# Patient Record
Sex: Male | Born: 1960 | Race: Black or African American | Hispanic: No | Marital: Single | Smoking: Current every day smoker
Health system: Southern US, Community
[De-identification: ages and names within clinical notes are randomized; demographics above are authoritative.]

## PROBLEM LIST (undated history)

## (undated) DIAGNOSIS — I10 Essential (primary) hypertension: Secondary | ICD-10-CM

## (undated) DIAGNOSIS — F32A Depression, unspecified: Secondary | ICD-10-CM

## (undated) DIAGNOSIS — F329 Major depressive disorder, single episode, unspecified: Secondary | ICD-10-CM

## (undated) DIAGNOSIS — K859 Acute pancreatitis without necrosis or infection, unspecified: Secondary | ICD-10-CM

## (undated) DIAGNOSIS — Z8659 Personal history of other mental and behavioral disorders: Secondary | ICD-10-CM

## (undated) DIAGNOSIS — M1990 Unspecified osteoarthritis, unspecified site: Secondary | ICD-10-CM

## (undated) HISTORY — DX: Unspecified osteoarthritis, unspecified site: M19.90

## (undated) HISTORY — DX: Depression, unspecified: F32.A

## (undated) HISTORY — DX: Acute pancreatitis without necrosis or infection, unspecified: K85.90

## (undated) HISTORY — DX: Personal history of other mental and behavioral disorders: Z86.59

## (undated) HISTORY — DX: Essential (primary) hypertension: I10

## (undated) HISTORY — DX: Major depressive disorder, single episode, unspecified: F32.9

---

## 2012-07-31 DIAGNOSIS — K859 Acute pancreatitis without necrosis or infection, unspecified: Secondary | ICD-10-CM

## 2012-07-31 HISTORY — DX: Acute pancreatitis without necrosis or infection, unspecified: K85.90

## 2015-11-01 ENCOUNTER — Encounter: Payer: Self-pay | Admitting: *Deleted

## 2015-11-01 ENCOUNTER — Telehealth: Payer: Self-pay | Admitting: *Deleted

## 2015-11-01 NOTE — Telephone Encounter (Signed)
Noted  

## 2015-11-01 NOTE — Telephone Encounter (Signed)
Pre-Visit Call completed with patient and chart updated.   Pre-Visit Info documented in Specialty Comments under [employer].    

## 2015-11-01 NOTE — Telephone Encounter (Signed)
Mitchell Ramirez, see below. Pt is new pt scheduled tomorrow at 2:30pm.   Pt would like CPE and overall "check up" to see how he's doing. "It's been a minute" since he has had any medical care. He is aware that CPE appt will need to be scheduled after tomorrow's appt. He reports he takes Percocet regularly for various aches/pain, but this has never been prescribed to him. He states that it is getting too expensive to buy Percocet off the street, and he would like to discuss pain management options.

## 2015-11-02 ENCOUNTER — Encounter: Payer: Self-pay | Admitting: Physician Assistant

## 2015-11-02 ENCOUNTER — Ambulatory Visit (HOSPITAL_BASED_OUTPATIENT_CLINIC_OR_DEPARTMENT_OTHER)
Admission: RE | Admit: 2015-11-02 | Discharge: 2015-11-02 | Disposition: A | Discharge status: Home / Self Care | Payer: BLUE CROSS/BLUE SHIELD | Source: Ambulatory Visit | Attending: Physician Assistant | Admitting: Physician Assistant

## 2015-11-02 ENCOUNTER — Ambulatory Visit (INDEPENDENT_AMBULATORY_CARE_PROVIDER_SITE_OTHER): Payer: BLUE CROSS/BLUE SHIELD | Admitting: Physician Assistant

## 2015-11-02 VITALS — BP 140/90 | HR 78 | Temp 98.7°F | Ht 65.0 in | Wt 196.8 lb

## 2015-11-02 DIAGNOSIS — M545 Low back pain, unspecified: Secondary | ICD-10-CM

## 2015-11-02 DIAGNOSIS — M25561 Pain in right knee: Secondary | ICD-10-CM | POA: Diagnosis not present

## 2015-11-02 DIAGNOSIS — F17200 Nicotine dependence, unspecified, uncomplicated: Secondary | ICD-10-CM | POA: Diagnosis not present

## 2015-11-02 DIAGNOSIS — M25562 Pain in left knee: Secondary | ICD-10-CM

## 2015-11-02 DIAGNOSIS — G8929 Other chronic pain: Secondary | ICD-10-CM

## 2015-11-02 DIAGNOSIS — F1921 Other psychoactive substance dependence, in remission: Secondary | ICD-10-CM

## 2015-11-02 DIAGNOSIS — M5136 Other intervertebral disc degeneration, lumbar region: Secondary | ICD-10-CM | POA: Diagnosis not present

## 2015-11-02 DIAGNOSIS — F1090 Alcohol use, unspecified, uncomplicated: Secondary | ICD-10-CM

## 2015-11-02 DIAGNOSIS — F1099 Alcohol use, unspecified with unspecified alcohol-induced disorder: Secondary | ICD-10-CM | POA: Diagnosis not present

## 2015-11-02 DIAGNOSIS — IMO0002 Reserved for concepts with insufficient information to code with codable children: Secondary | ICD-10-CM

## 2015-11-02 MED ORDER — VARENICLINE TARTRATE 0.5 MG X 11 & 1 MG X 42 PO MISC: ORAL | Status: AC

## 2015-11-02 MED ORDER — DICLOFENAC POTASSIUM 50 MG PO TABS: 50.0000 mg | ORAL_TABLET | Freq: Two times a day (BID) | ORAL | Status: DC

## 2015-11-02 NOTE — Progress Notes (Signed)
Patient presents to clinic today to establish care.  Acute Concerns: Patient c/o chronic pain in lower back and bilateral knees. Denies known trauma or injury. Endorses persistent aching pains in these areas without radiation. Has been taking Percocet for pain. States he is not prescribed the medication but has been getting it off of the street.   Chronic Issues: History of Alcohol and Drug Abuse -- Endorses prior cocaine use > 10 years ago. Also has history of alcohol abuse with multiple episodes of alcoholic pancreatitis.  Endorses he has significantly reduced alcohol consumption to maybe 1-2 beers each day. Is not currently attending AA.   Tobacco Abuse Disorder -- Endorses 1 ppd of cigarettes x 20 years. Is ready to quit. Would like to discuss options.   Health Maintenance: Immunizations -- Endorses Tetanus given 07/31/14.  Past Medical History  Diagnosis Date  . Pancreatitis 2014  . Arthritis   . Depression   . History of eating disorder   . Hypertension     History reviewed. No pertinent past surgical history.  Current Outpatient Prescriptions on File Prior to Visit  Medication Sig Dispense Refill  . loratadine (CLARITIN) 10 MG tablet Take 10 mg by mouth daily as needed for allergies.    . Multiple Vitamins-Minerals (MULTIVITAMIN ADULT PO) Take 1 tablet by mouth daily.    . Oxymetazoline HCl (NASAL SPRAY NA) Place 1 spray into both nostrils daily. PRN Allergies     No current facility-administered medications on file prior to visit.    No Known Allergies  Family History  Problem Relation Age of Onset  . Hypertension Mother   . Arthritis Mother   . Cancer Father     Colon    Social History   Social History  . Marital Status: Single    Spouse Name: N/A  . Number of Children: 6  . Years of Education: N/A   Occupational History  . Not on file.   Social History Main Topics  . Smoking status: Current Every Day Smoker -- 1.00 packs/day    Types: Cigarettes   Start date: 11/02/1995  . Smokeless tobacco: Never Used  . Alcohol Use: 0.0 oz/week    0 Standard drinks or equivalent per week     Comment: 2-3 Beers per day  . Drug Use: No  . Sexual Activity: Not on file   Other Topics Concern  . Not on file   Social History Narrative   Review of Systems  Constitutional: Negative for fever and weight loss.  HENT: Negative for ear discharge, ear pain, hearing loss and tinnitus.   Eyes: Negative for blurred vision, double vision, photophobia and pain.  Respiratory: Negative for cough and shortness of breath.   Cardiovascular: Negative for chest pain and palpitations.  Gastrointestinal: Negative for heartburn, nausea, vomiting, abdominal pain, diarrhea, constipation, blood in stool and melena.  Genitourinary: Negative for dysuria, urgency, frequency, hematuria and flank pain.  Musculoskeletal: Positive for joint pain. Negative for falls.  Neurological: Negative for dizziness, loss of consciousness and headaches.  Endo/Heme/Allergies: Negative for environmental allergies.  Psychiatric/Behavioral: Positive for substance abuse. Negative for depression, suicidal ideas and hallucinations. The patient is not nervous/anxious and does not have insomnia.    BP 140/90 mmHg  Pulse 78  Temp(Src) 98.7 F (37.1 C) (Oral)  Ht 5\' 5"  (1.651 m)  Wt 196 lb 12.8 oz (89.268 kg)  BMI 32.75 kg/m2  SpO2 97%  Physical Exam  Constitutional: He is oriented to person, place, and time and well-developed,  well-nourished, and in no distress.  HENT:  Head: Normocephalic and atraumatic.  Eyes: Conjunctivae are normal.  Neck: Neck supple.  Cardiovascular: Normal rate, regular rhythm, normal heart sounds and intact distal pulses.   Pulmonary/Chest: Effort normal and breath sounds normal. No respiratory distress. He has no wheezes. He has no rales. He exhibits no tenderness.  Musculoskeletal:       Right knee: Normal.       Left knee: Normal.       Thoracic back: Normal.         Lumbar back: He exhibits pain. He exhibits normal range of motion, no tenderness, no bony tenderness and no spasm.  Neurological: He is alert and oriented to person, place, and time.  Skin: Skin is warm and dry. No rash noted.  Psychiatric: Affect normal.  Vitals reviewed.    Assessment/Plan: Alcohol use disorder (HCC) Currently drinking 1-2 beers a night. Discussed effect of alcohol on liver function, blood vessels/blood pressure, etc. Will work on complete cessation. AA recommended.  Tobacco use disorder Ready to quit. Discussed options for smoking cessation. Unable to previously tolerate patches. Will begin Chantix starter pack. FU 1 month.  Bilateral knee pain Suspect osteoarthritis. Discussed imaging. Patient declines today. Will begin trial of diclofenac. FU 1 month. Giving history of substance abuse and that he has been using Percocet off of the street, we will not be prescribing controlled medications due to risk of abuse. He is aware that he is not to take medications that are not prescribed to him.  Chronic low back pain Suspect OA. Will proceed with x-ray lumbar spine. Will begin Diclofenac. Supportive measures reviewed. FU as scheduled.   History of drug dependence/abuse (HCC) Endorses prior cocaine abuse. Also with history of alcohol misuse and current tobacco abuse disorder. Patient will not be a candidate for controlled medications due to abuse potential. Added controlled medication caution to patient's chart.

## 2015-11-02 NOTE — Progress Notes (Signed)
Pre visit review using our clinic review tool, if applicable. No additional management support is needed unless otherwise documented below in the visit note. 

## 2015-11-02 NOTE — Patient Instructions (Signed)
Please go downstairs for imaging. I will call with your results.  Please start the Diclofenac as directed with food. Topical Icy Hot or Aspercreme to the knees and lower back. We will alter regimen based on results.   Do not take prescription medications that are not prescribed for you.  Once tolerating the first medication for about 4-5 days, you can start the Chantix for smoking cessation.   Please call either of the following for outpatient alcohol abuse programs: (1) [facility] -- [phone] (2) [facility] -- [phone] (3) [facility] -- [phone]  Follow-up with me in 1 month. I recommend we do a physical at that time.

## 2015-11-07 DIAGNOSIS — G8929 Other chronic pain: Secondary | ICD-10-CM | POA: Insufficient documentation

## 2015-11-07 DIAGNOSIS — IMO0002 Reserved for concepts with insufficient information to code with codable children: Secondary | ICD-10-CM | POA: Insufficient documentation

## 2015-11-07 DIAGNOSIS — F17200 Nicotine dependence, unspecified, uncomplicated: Secondary | ICD-10-CM | POA: Insufficient documentation

## 2015-11-07 DIAGNOSIS — M545 Low back pain: Principal | ICD-10-CM

## 2015-11-07 DIAGNOSIS — M25561 Pain in right knee: Secondary | ICD-10-CM | POA: Insufficient documentation

## 2015-11-07 DIAGNOSIS — F1921 Other psychoactive substance dependence, in remission: Secondary | ICD-10-CM | POA: Insufficient documentation

## 2015-11-07 DIAGNOSIS — M25562 Pain in left knee: Secondary | ICD-10-CM

## 2015-11-07 NOTE — Assessment & Plan Note (Signed)
Currently drinking 1-2 beers a night. Discussed effect of alcohol on liver function, blood vessels/blood pressure, etc. Will work on complete cessation. AA recommended.

## 2015-11-07 NOTE — Assessment & Plan Note (Signed)
Ready to quit. Discussed options for smoking cessation. Unable to previously tolerate patches. Will begin Chantix starter pack. FU 1 month.

## 2015-11-07 NOTE — Assessment & Plan Note (Signed)
Endorses prior cocaine abuse. Also with history of alcohol misuse and current tobacco abuse disorder. Patient will not be a candidate for controlled medications due to abuse potential. Added controlled medication caution to patient's chart.

## 2015-11-07 NOTE — Assessment & Plan Note (Signed)
Suspect osteoarthritis. Discussed imaging. Patient declines today. Will begin trial of diclofenac. FU 1 month. Giving history of substance abuse and that he has been using Percocet off of the street, we will not be prescribing controlled medications due to risk of abuse. He is aware that he is not to take medications that are not prescribed to him.

## 2015-11-07 NOTE — Assessment & Plan Note (Signed)
Suspect OA. Will proceed with x-ray lumbar spine. Will begin Diclofenac. Supportive measures reviewed. FU as scheduled.

## 2015-11-22 ENCOUNTER — Ambulatory Visit (INDEPENDENT_AMBULATORY_CARE_PROVIDER_SITE_OTHER): Payer: BLUE CROSS/BLUE SHIELD | Admitting: Physician Assistant

## 2015-11-22 ENCOUNTER — Encounter: Payer: Self-pay | Admitting: Physician Assistant

## 2015-11-22 ENCOUNTER — Telehealth: Payer: Self-pay | Admitting: Physician Assistant

## 2015-11-22 VITALS — BP 148/96 | HR 86 | Temp 98.2°F | Ht 67.0 in | Wt 199.6 lb

## 2015-11-22 DIAGNOSIS — R7989 Other specified abnormal findings of blood chemistry: Secondary | ICD-10-CM

## 2015-11-22 DIAGNOSIS — Z0000 Encounter for general adult medical examination without abnormal findings: Secondary | ICD-10-CM

## 2015-11-22 DIAGNOSIS — Z125 Encounter for screening for malignant neoplasm of prostate: Secondary | ICD-10-CM | POA: Diagnosis not present

## 2015-11-22 DIAGNOSIS — G894 Chronic pain syndrome: Secondary | ICD-10-CM | POA: Diagnosis not present

## 2015-11-22 DIAGNOSIS — IMO0002 Reserved for concepts with insufficient information to code with codable children: Secondary | ICD-10-CM

## 2015-11-22 DIAGNOSIS — Z8719 Personal history of other diseases of the digestive system: Secondary | ICD-10-CM

## 2015-11-22 DIAGNOSIS — F1099 Alcohol use, unspecified with unspecified alcohol-induced disorder: Secondary | ICD-10-CM | POA: Diagnosis not present

## 2015-11-22 LAB — COMPREHENSIVE METABOLIC PANEL
ALT: 17 U/L (ref 0–53)
AST: 26 U/L (ref 0–37)
Albumin: 4.3 g/dL (ref 3.5–5.2)
Alkaline Phosphatase: 58 U/L (ref 39–117)
BUN: 4 mg/dL — AB (ref 6–23)
CHLORIDE: 97 meq/L (ref 96–112)
CO2: 32 meq/L (ref 19–32)
CREATININE: 0.79 mg/dL (ref 0.40–1.50)
Calcium: 9.8 mg/dL (ref 8.4–10.5)
GFR: 132.11 mL/min (ref >60.00)
Glucose, Bld: 190 mg/dL — ABNORMAL HIGH (ref 70–99)
Potassium: 4.2 mEq/L (ref 3.5–5.1)
SODIUM: 138 meq/L (ref 135–145)
Total Bilirubin: 0.3 mg/dL (ref 0.2–1.2)
Total Protein: 6.5 g/dL (ref 6.0–8.3)

## 2015-11-22 LAB — LIPID PANEL
CHOL/HDL RATIO: 3
Cholesterol: 201 mg/dL — ABNORMAL HIGH (ref 0–200)
HDL: 67.1 mg/dL (ref >39.00)
NONHDL: 134.13
Triglycerides: 307 mg/dL — ABNORMAL HIGH (ref 0.0–149.0)
VLDL: 61.4 mg/dL — ABNORMAL HIGH (ref 0.0–40.0)

## 2015-11-22 LAB — LDL CHOLESTEROL, DIRECT: LDL DIRECT: 102 mg/dL

## 2015-11-22 LAB — PSA: PSA: 0.44 ng/mL (ref 0.10–4.00)

## 2015-11-22 LAB — URINALYSIS, ROUTINE W REFLEX MICROSCOPIC
Bilirubin Urine: NEGATIVE
HGB URINE DIPSTICK: NEGATIVE
Ketones, ur: NEGATIVE
Leukocytes, UA: NEGATIVE
Nitrite: NEGATIVE
PH: 7.5 (ref 5.0–8.0)
RBC / HPF: NONE SEEN (ref 0–?)
Specific Gravity, Urine: 1.01 (ref 1.000–1.030)
TOTAL PROTEIN, URINE-UPE24: NEGATIVE
Urine Glucose: 100 — AB
Urobilinogen, UA: 0.2 (ref 0.0–1.0)
WBC, UA: NONE SEEN (ref 0–?)

## 2015-11-22 LAB — CBC
HCT: 42 % (ref 39.0–52.0)
Hemoglobin: 14.2 g/dL (ref 13.0–17.0)
MCHC: 33.7 g/dL (ref 30.0–36.0)
MCV: 97.3 fl (ref 78.0–100.0)
Platelets: 343 10*3/uL (ref 150.0–400.0)
RBC: 4.32 Mil/uL (ref 4.22–5.81)
RDW: 12.2 % (ref 11.5–15.5)
WBC: 7.2 10*3/uL (ref 4.0–10.5)

## 2015-11-22 LAB — HEMOGLOBIN A1C: HEMOGLOBIN A1C: 8.1 % — AB (ref 4.6–6.5)

## 2015-11-22 LAB — TSH: TSH: 0.94 u[IU]/mL (ref 0.35–4.50)

## 2015-11-22 MED ORDER — GABAPENTIN 100 MG PO CAPS: 100.0000 mg | ORAL_CAPSULE | Freq: Two times a day (BID) | ORAL | Status: DC

## 2015-11-22 MED ORDER — GABAPENTIN 100 MG PO CAPS: 100.0000 mg | ORAL_CAPSULE | Freq: Three times a day (TID) | ORAL | Status: DC

## 2015-11-22 MED ORDER — CYCLOBENZAPRINE HCL 5 MG PO TABS: 5.0000 mg | ORAL_TABLET | Freq: Every day | ORAL | Status: DC

## 2015-11-22 MED ORDER — ONDANSETRON HCL 4 MG PO TABS: 4.0000 mg | ORAL_TABLET | Freq: Three times a day (TID) | ORAL | Status: DC | PRN

## 2015-11-22 NOTE — Telephone Encounter (Signed)
Have him call [phone] -- this is for a place [facility] [place] [place] [facility] be able to help.

## 2015-11-22 NOTE — Patient Instructions (Signed)
Please go to the lab for blood work.   Our office will call you with your results unless you have chosen to receive results via MyChart.  If your blood work is normal we will follow-up each year for physicals and as scheduled for chronic medical problems.  If anything is abnormal we will treat accordingly and get you in for a follow-up.  Please take the new medications as directed.  Please call [facility] [phone]. You would benefit greatly from their programs.  Preventive Care for Adults, Male A healthy lifestyle and preventive care can promote health and wellness. Preventive health guidelines for men include the following key practices:  A routine yearly physical is a good way to check with your health care provider about your health and preventative screening. It is a chance to share any concerns and updates on your health and to receive a thorough exam.  Visit your dentist for a routine exam and preventative care every 6 months. Brush your teeth twice a day and floss once a day. Good oral hygiene prevents tooth decay and gum disease.  The frequency of eye exams is based on your age, health, family medical history, use of contact lenses, and other factors. Follow your health care provider's recommendations for frequency of eye exams.  Eat a healthy diet. Foods such as vegetables, fruits, whole grains, low-fat dairy products, and lean protein foods contain the nutrients you need without too many calories. Decrease your intake of foods high in solid fats, added sugars, and salt. Eat the right amount of calories for you.Get information about a proper diet from your health care provider, if necessary.  Regular physical exercise is one of the most important things you can do for your health. Most adults should get at least 150 minutes of moderate-intensity exercise (any activity that increases your heart rate and causes you to sweat) each week. In addition, most adults need  muscle-strengthening exercises on 2 or more days a week.  Maintain a healthy weight. The body mass index (BMI) is a screening tool to identify possible weight problems. It provides an estimate of body fat based on height and weight. Your health care provider can find your BMI and can help you achieve or maintain a healthy weight.For adults 20 years and older:  A BMI below 18.5 is considered underweight.  A BMI of 18.5 to 24.9 is normal.  A BMI of 25 to 29.9 is considered overweight.  A BMI of 30 and above is considered obese.  Maintain normal blood lipids and cholesterol levels by exercising and minimizing your intake of saturated fat. Eat a balanced diet with plenty of fruit and vegetables. Blood tests for lipids and cholesterol should begin at age 71 and be repeated every 5 years. If your lipid or cholesterol levels are high, you are over 50, or you are at high risk for heart disease, you may need your cholesterol levels checked more frequently.Ongoing high lipid and cholesterol levels should be treated with medicines if diet and exercise are not working.  If you smoke, find out from your health care provider how to quit. If you do not use tobacco, do not start.  Lung cancer screening is recommended for adults aged 53-80 years who are at high risk for developing lung cancer because of a history of smoking. A yearly low-dose CT scan of the lungs is recommended for people who have at least a 30-pack-year history of smoking and are a current smoker or have quit within the  past 15 years. A pack year of smoking is smoking an average of 1 pack of cigarettes a day for 1 year (for example: 1 pack a day for 30 years or 2 packs a day for 15 years). Yearly screening should continue until the smoker has stopped smoking for at least 15 years. Yearly screening should be stopped for people who develop a health problem that would prevent them from having lung cancer treatment.  If you choose to drink alcohol,  do not have more than 2 drinks per day. One drink is considered to be 12 ounces (355 mL) of beer, 5 ounces (148 mL) of wine, or 1.5 ounces (44 mL) of liquor.  Avoid use of street drugs. Do not share needles with anyone. Ask for help if you need support or instructions about stopping the use of drugs.  High blood pressure causes heart disease and increases the risk of stroke. Your blood pressure should be checked at least every 1-2 years. Ongoing high blood pressure should be treated with medicines, if weight loss and exercise are not effective.  If you are 4-79 years old, ask your health care provider if you should take aspirin to prevent heart disease.  Diabetes screening is done by taking a blood sample to check your blood glucose level after you have not eaten for a certain period of time (fasting). If you are not overweight and you do not have risk factors for diabetes, you should be screened once every 3 years starting at age 45. If you are overweight or obese and you are 49-61 years of age, you should be screened for diabetes every year as part of your cardiovascular risk assessment.  Colorectal cancer can be detected and often prevented. Most routine colorectal cancer screening begins at the age of 64 and continues through age 14. However, your health care provider may recommend screening at an earlier age if you have risk factors for colon cancer. On a yearly basis, your health care provider may provide home test kits to check for hidden blood in the stool. Use of a small camera at the end of a tube to directly examine the colon (sigmoidoscopy or colonoscopy) can detect the earliest forms of colorectal cancer. Talk to your health care provider about this at age 52, when routine screening begins. Direct exam of the colon should be repeated every 5-10 years through age 63, unless early forms of precancerous polyps or small growths are found.  People who are at an increased risk for hepatitis B  should be screened for this virus. You are considered at high risk for hepatitis B if:  You were born in a country where hepatitis B occurs often. Talk with your health care provider about which countries are considered high risk.  Your parents were born in a high-risk country and you have not received a shot to protect against hepatitis B (hepatitis B vaccine).  You have HIV or AIDS.  You use needles to inject street drugs.  You live with, or have sex with, someone who has hepatitis B.  You are a man who has sex with other men (MSM).  You get hemodialysis treatment.  You take certain medicines for conditions such as cancer, organ transplantation, and autoimmune conditions.  Hepatitis C blood testing is recommended for all people born from [birth year] through 1965 and any individual with known risks for hepatitis C.  Practice safe sex. Use condoms and avoid high-risk sexual practices to reduce the spread of sexually transmitted infections (  STIs). STIs include gonorrhea, chlamydia, syphilis, trichomonas, herpes, HPV, and human immunodeficiency virus (HIV). Herpes, HIV, and HPV are viral illnesses that have no cure. They can result in disability, cancer, and death.  If you are a man who has sex with other men, you should be screened at least once per year for:  HIV.  Urethral, rectal, and pharyngeal infection of gonorrhea, chlamydia, or both.  If you are at risk of being infected with HIV, it is recommended that you take a prescription medicine daily to prevent HIV infection. This is called preexposure prophylaxis (PrEP). You are considered at risk if:  You are a man who has sex with other men (MSM) and have other risk factors.  You are a heterosexual man, are sexually active, and are at increased risk for HIV infection.  You take drugs by injection.  You are sexually active with a partner who has HIV.  Talk with your health care provider about whether you are at high risk of being  infected with HIV. If you choose to begin PrEP, you should first be tested for HIV. You should then be tested every 3 months for as long as you are taking PrEP.  A one-time screening for abdominal aortic aneurysm (AAA) and surgical repair of large AAAs by ultrasound are recommended for men ages 47 to 12 years who are current or former smokers.  Healthy men should no longer receive prostate-specific antigen (PSA) blood tests as part of routine cancer screening. Talk with your health care provider about prostate cancer screening.  Testicular cancer screening is not recommended for adult males who have no symptoms. Screening includes self-exam, a health care provider exam, and other screening tests. Consult with your health care provider about any symptoms you have or any concerns you have about testicular cancer.  Use sunscreen. Apply sunscreen liberally and repeatedly throughout the day. You should seek shade when your shadow is shorter than you. Protect yourself by wearing long sleeves, pants, a wide-brimmed hat, and sunglasses year round, whenever you are outdoors.  Once a month, do a whole-body skin exam, using a mirror to look at the skin on your back. Tell your health care provider about new moles, moles that have irregular borders, moles that are larger than a pencil eraser, or moles that have changed in shape or color.  Stay current with required vaccines (immunizations).  Influenza vaccine. All adults should be immunized every year.  Tetanus, diphtheria, and acellular pertussis (Td, Tdap) vaccine. An adult who has not previously received Tdap or who does not know his vaccine status should receive 1 dose of Tdap. This initial dose should be followed by tetanus and diphtheria toxoids (Td) booster doses every 10 years. Adults with an unknown or incomplete history of completing a 3-dose immunization series with Td-containing vaccines should begin or complete a primary immunization series including  a Tdap dose. Adults should receive a Td booster every 10 years.  Varicella vaccine. An adult without evidence of immunity to varicella should receive 2 doses or a second dose if he has previously received 1 dose.  Human papillomavirus (HPV) vaccine. Males aged 11-21 years who have not received the vaccine previously should receive the 3-dose series. Males aged 22-26 years may be immunized. Immunization is recommended through the age of 49 years for any male who has sex with males and did not get any or all doses earlier. Immunization is recommended for any person with an immunocompromised condition through the age of 35 years if  he did not get any or all doses earlier. During the 3-dose series, the second dose should be obtained 4-8 weeks after the first dose. The third dose should be obtained 24 weeks after the first dose and 16 weeks after the second dose.  Zoster vaccine. One dose is recommended for adults aged 25 years or older unless certain conditions are present.  Measles, mumps, and rubella (MMR) vaccine. Adults born before [birth year] generally are considered immune to measles and mumps. Adults born in [birth year] or later should have 1 or more doses of MMR vaccine unless there is a contraindication to the vaccine or there is laboratory evidence of immunity to each of the three diseases. A routine second dose of MMR vaccine should be obtained at least 28 days after the first dose for students attending postsecondary schools, health care workers, or international travelers. People who received inactivated measles vaccine or an unknown type of measles vaccine during 1963-1967 should receive 2 doses of MMR vaccine. People who received inactivated mumps vaccine or an unknown type of mumps vaccine before 1979 and are at high risk for mumps infection should consider immunization with 2 doses of MMR vaccine. Unvaccinated health care workers born before [birth year] who lack laboratory evidence of measles, mumps, or rubella  immunity or laboratory confirmation of disease should consider measles and mumps immunization with 2 doses of MMR vaccine or rubella immunization with 1 dose of MMR vaccine.  Pneumococcal 13-valent conjugate (PCV13) vaccine. When indicated, a person who is uncertain of his immunization history and has no record of immunization should receive the PCV13 vaccine. All adults 17 years of age and older should receive this vaccine. An adult aged 44 years or older who has certain medical conditions and has not been previously immunized should receive 1 dose of PCV13 vaccine. This PCV13 should be followed with a dose of pneumococcal polysaccharide (PPSV23) vaccine. Adults who are at high risk for pneumococcal disease should obtain the PPSV23 vaccine at least 8 weeks after the dose of PCV13 vaccine. Adults older than 53 years of age who have normal immune system function should obtain the PPSV23 vaccine dose at least 1 year after the dose of PCV13 vaccine.  Pneumococcal polysaccharide (PPSV23) vaccine. When PCV13 is also indicated, PCV13 should be obtained first. All adults aged 10 years and older should be immunized. An adult younger than age 66 years who has certain medical conditions should be immunized. Any person who resides in a nursing home or long-term care facility should be immunized. An adult smoker should be immunized. People with an immunocompromised condition and certain other conditions should receive both PCV13 and PPSV23 vaccines. People with human immunodeficiency virus (HIV) infection should be immunized as soon as possible after diagnosis. Immunization during chemotherapy or radiation therapy should be avoided. Routine use of PPSV23 vaccine is not recommended for American Indians, [place] Natives, or people younger than 65 years unless there are medical conditions that require PPSV23 vaccine. When indicated, people who have unknown immunization and have no record of immunization should receive PPSV23  vaccine. One-time revaccination 5 years after the first dose of PPSV23 is recommended for people aged 19-64 years who have chronic kidney failure, nephrotic syndrome, asplenia, or immunocompromised conditions. People who received 1-2 doses of PPSV23 before age 19 years should receive another dose of PPSV23 vaccine at age 74 years or later if at least 5 years have passed since the previous dose. Doses of PPSV23 are not needed for people immunized with PPSV23 at or  after age 57 years.  Meningococcal vaccine. Adults with asplenia or persistent complement component deficiencies should receive 2 doses of quadrivalent meningococcal conjugate (MenACWY-D) vaccine. The doses should be obtained at least 2 months apart. Microbiologists working with certain meningococcal bacteria, [place] recruits, people at risk during an outbreak, and people who travel to or live in countries with a high rate of meningitis should be immunized. A first-year college student up through age 28 years who is living in a residence hall should receive a dose if he did not receive a dose on or after his 16th birthday. Adults who have certain high-risk conditions should receive one or more doses of vaccine.  Hepatitis A vaccine. Adults who wish to be protected from this disease, have chronic liver disease, work with hepatitis A-infected animals, work in hepatitis A research labs, or travel to or work in countries with a high rate of hepatitis A should be immunized. Adults who were previously unvaccinated and who anticipate close contact with an international adoptee during the first 60 days after arrival in the [place] from a country with a high rate of hepatitis A should be immunized.  Hepatitis B vaccine. Adults should be immunized if they wish to be protected from this disease, are under age 67 years and have diabetes, have chronic liver disease, have had more than one sex partner in the past 6 months, may be exposed to blood or other  infectious body fluids, are household contacts or sex partners of hepatitis B positive people, are clients or workers in certain care facilities, or travel to or work in countries with a high rate of hepatitis B.  Haemophilus influenzae type b (Hib) vaccine. A previously unvaccinated person with asplenia or sickle cell disease or having a scheduled splenectomy should receive 1 dose of Hib vaccine. Regardless of previous immunization, a recipient of a hematopoietic stem cell transplant should receive a 3-dose series 6-12 months after his successful transplant. Hib vaccine is not recommended for adults with HIV infection. Preventive Service / Frequency Ages 86 to 88  Blood pressure check.** / Every 3-5 years.  Lipid and cholesterol check.** / Every 5 years beginning at age 68.  Hepatitis C blood test.** / For any individual with known risks for hepatitis C.  Skin self-exam. / Monthly.  Influenza vaccine. / Every year.  Tetanus, diphtheria, and acellular pertussis (Tdap, Td) vaccine.** / Consult your health care provider. 1 dose of Td every 10 years.  Varicella vaccine.** / Consult your health care provider.  HPV vaccine. / 3 doses over 6 months, if 61 or younger.  Measles, mumps, rubella (MMR) vaccine.** / You need at least 1 dose of MMR if you were born in 1957 or later. You may also need a second dose.  Pneumococcal 13-valent conjugate (PCV13) vaccine.** / Consult your health care provider.  Pneumococcal polysaccharide (PPSV23) vaccine.** / 1 to 2 doses if you smoke cigarettes or if you have certain conditions.  Meningococcal vaccine.** / 1 dose if you are age 46 to 29 years and a [employer] researcher living in a residence hall, or have one of several medical conditions. You may also need additional booster doses.  Hepatitis A vaccine.** / Consult your health care provider.  Hepatitis B vaccine.** / Consult your health care provider.  Haemophilus influenzae type b (Hib)  vaccine.** / Consult your health care provider. Ages 75 to 67  Blood pressure check.** / Every year.  Lipid and cholesterol check.** / Every 5 years beginning at age  20.  Lung cancer screening. / Every year if you are aged 39-80 years and have a 30-pack-year history of smoking and currently smoke or have quit within the past 15 years. Yearly screening is stopped once you have quit smoking for at least 15 years or develop a health problem that would prevent you from having lung cancer treatment.  Fecal occult blood test (FOBT) of stool. / Every year beginning at age 61 and continuing until age 29. You may not have to do this test if you get a colonoscopy every 10 years.  Flexible sigmoidoscopy** or colonoscopy.** / Every 5 years for a flexible sigmoidoscopy or every 10 years for a colonoscopy beginning at age 46 and continuing until age 49.  Hepatitis C blood test.** / For all people born from [birth year] through 1965 and any individual with known risks for hepatitis C.  Skin self-exam. / Monthly.  Influenza vaccine. / Every year.  Tetanus, diphtheria, and acellular pertussis (Tdap/Td) vaccine.** / Consult your health care provider. 1 dose of Td every 10 years.  Varicella vaccine.** / Consult your health care provider.  Zoster vaccine.** / 1 dose for adults aged 35 years or older.  Measles, mumps, rubella (MMR) vaccine.** / You need at least 1 dose of MMR if you were born in 1957 or later. You may also need a second dose.  Pneumococcal 13-valent conjugate (PCV13) vaccine.** / Consult your health care provider.  Pneumococcal polysaccharide (PPSV23) vaccine.** / 1 to 2 doses if you smoke cigarettes or if you have certain conditions.  Meningococcal vaccine.** / Consult your health care provider.  Hepatitis A vaccine.** / Consult your health care provider.  Hepatitis B vaccine.** / Consult your health care provider.  Haemophilus influenzae type b (Hib) vaccine.** / Consult your health care  provider. Ages 69 and over  Blood pressure check.** / Every year.  Lipid and cholesterol check.**/ Every 5 years beginning at age 56.  Lung cancer screening. / Every year if you are aged 49-80 years and have a 30-pack-year history of smoking and currently smoke or have quit within the past 15 years. Yearly screening is stopped once you have quit smoking for at least 15 years or develop a health problem that would prevent you from having lung cancer treatment.  Fecal occult blood test (FOBT) of stool. / Every year beginning at age 49 and continuing until age 67. You may not have to do this test if you get a colonoscopy every 10 years.  Flexible sigmoidoscopy** or colonoscopy.** / Every 5 years for a flexible sigmoidoscopy or every 10 years for a colonoscopy beginning at age 90 and continuing until age 22.  Hepatitis C blood test.** / For all people born from [birth year] through 1965 and any individual with known risks for hepatitis C.  Abdominal aortic aneurysm (AAA) screening.** / A one-time screening for ages 22 to 74 years who are current or former smokers.  Skin self-exam. / Monthly.  Influenza vaccine. / Every year.  Tetanus, diphtheria, and acellular pertussis (Tdap/Td) vaccine.** / 1 dose of Td every 10 years.  Varicella vaccine.** / Consult your health care provider.  Zoster vaccine.** / 1 dose for adults aged 36 years or older.  Pneumococcal 13-valent conjugate (PCV13) vaccine.** / 1 dose for all adults aged 57 years and older.  Pneumococcal polysaccharide (PPSV23) vaccine.** / 1 dose for all adults aged 59 years and older.  Meningococcal vaccine.** / Consult your health care provider.  Hepatitis A vaccine.** / Consult your health care provider.  Hepatitis B vaccine.** / Consult your health care provider.  Haemophilus influenzae type b (Hib) vaccine.** / Consult your health care provider. **Family history and personal history of risk and conditions may change your health care  provider's recommendations.   This information is not intended to replace advice given to you by your health care provider. Make sure you discuss any questions you have with your health care provider.   Document Released: 09/12/2001 Document Revised: 08/07/2014 Document Reviewed: 12/12/2010 Elsevier Interactive Patient Education [employer].

## 2015-11-22 NOTE — Telephone Encounter (Signed)
Caller name: Self  Can be reached: [phone]  Reason for call: Patient called stating that the number he was given for [facility] is a non-working number. Also states that what he need is a local program for Alcoholics.

## 2015-11-22 NOTE — Progress Notes (Signed)
Patient presents to clinic today for annual exam.  Patient is fasting for labs.  Chronic Issues: Chronic low back pain -- x-ray revealed significant OA. Have previously discussed with patient the need for specialty. The patient is adamant that he wants Oxycodone for his pain. Had previously discussed that he had been getting off the street. Has history of drug abuse, tobacco abuse and current excessive consumption of alcohol. Patient states the Toradol is not helping.   Alcohol Use Disorder -- Endorses currently averaging around 6-8 beers per day. Sometimes with liquor added. Has history of pancreatitis and polysubstance abuse.   Health Maintenance: Immunizations -- up-to-date Colonoscopy -- Would like to try Cologuard.  HIV screen -- Endorses negative result 2 years ago.  Past Medical History  Diagnosis Date  . Pancreatitis 2014  . Arthritis   . Depression   . History of eating disorder   . Hypertension     No past surgical history on file.  Current Outpatient Prescriptions on File Prior to Visit  Medication Sig Dispense Refill  . fluticasone (FLONASE) 50 MCG/ACT nasal spray USE 2 SPRAYS IN EACH SIDE DAILY  2  . loratadine (CLARITIN) 10 MG tablet Take 10 mg by mouth daily as needed for allergies.    . Multiple Vitamins-Minerals (MULTIVITAMIN ADULT PO) Take 1 tablet by mouth daily.    . Oxymetazoline HCl (NASAL SPRAY NA) Place 1 spray into both nostrils daily. PRN Allergies    . varenicline (CHANTIX PAK) 0.5 MG X 11 & 1 MG X 42 tablet Take one 0.5 mg tablet by mouth once daily for 3 days, then increase to one 0.5 mg tablet twice daily for 4 days, then increase to one 1 mg tablet twice daily. (Patient not taking: Reported on 11/22/2015) 53 tablet 0   No current facility-administered medications on file prior to visit.    No Known Allergies  Family History  Problem Relation Age of Onset  . Hypertension Mother   . Arthritis Mother   . Cancer Father     Colon    Social  History   Social History  . Marital Status: Single    Spouse Name: N/A  . Number of Children: 6  . Years of Education: N/A   Occupational History  . Not on file.   Social History Main Topics  . Smoking status: Current Every Day Smoker -- 1.00 packs/day    Types: Cigarettes    Start date: 11/02/1995  . Smokeless tobacco: Never Used  . Alcohol Use: 0.0 oz/week    0 Standard drinks or equivalent per week  . Drug Use: No  . Sexual Activity: Not on file   Other Topics Concern  . Not on file   Social History Narrative    Review of Systems  Constitutional: Negative for fever and weight loss.  HENT: Negative for ear discharge, ear pain, hearing loss and tinnitus.   Eyes: Negative for blurred vision, double vision, photophobia and pain.  Respiratory: Negative for cough and shortness of breath.   Cardiovascular: Negative for chest pain and palpitations.  Gastrointestinal: Negative for heartburn, nausea, vomiting, abdominal pain, diarrhea, constipation, blood in stool and melena.  Genitourinary: Negative for dysuria, urgency, frequency, hematuria and flank pain.  Musculoskeletal: Positive for back pain and joint pain. Negative for falls.  Neurological: Negative for dizziness, loss of consciousness and headaches.  Endo/Heme/Allergies: Negative for environmental allergies.  Psychiatric/Behavioral: Positive for substance abuse. Negative for depression, suicidal ideas and hallucinations. The patient is not nervous/anxious  and does not have insomnia.    BP 148/96 mmHg  Pulse 86  Temp(Src) 98.2 F (36.8 C) (Oral)  Ht 5\' 7"  (1.702 m)  Wt 199 lb 9.6 oz (90.538 kg)  BMI 31.25 kg/m2  SpO2 99%  Physical Exam  Constitutional: He is oriented to person, place, and time and well-developed, well-nourished, and in no distress.  HENT:  Head: Normocephalic and atraumatic.  Right Ear: External ear normal.  Left Ear: External ear normal.  Nose: Nose normal.  Mouth/Throat: Oropharynx is clear  and moist. No oropharyngeal exudate.  Eyes: Conjunctivae and EOM are normal. Pupils are equal, round, and reactive to light.  Neck: Neck supple. No thyromegaly present.  Cardiovascular: Normal rate, regular rhythm, normal heart sounds and intact distal pulses.   Pulmonary/Chest: Effort normal and breath sounds normal. No respiratory distress. He has no wheezes. He has no rales. He exhibits no tenderness.  Abdominal: Soft. Bowel sounds are normal. He exhibits no distension and no mass. There is no tenderness. There is no rebound and no guarding.  Genitourinary: Testes/scrotum normal.  Lymphadenopathy:    He has no cervical adenopathy.  Neurological: He is alert and oriented to person, place, and time.  Skin: Skin is warm and dry. No rash noted.  Psychiatric: Affect normal.  Vitals reviewed.  Assessment/Plan: Visit for preventive health examination Depression screen negative. Health Maintenance reviewed -- Due for colonoscopy. Refuses but would like Cologuard. He will check coverage with his insurance.  Preventive schedule discussed and handout given in AVS. Will obtain fasting labs today.   Prostate cancer screening The natural history of prostate cancer and ongoing controversy regarding screening and potential treatment outcomes of prostate cancer has been discussed with the patient. The meaning of a false positive PSA and a false negative PSA has been discussed. He indicates understanding of the limitations of this screening test and wishes  to proceed with screening PSA testing.   Chronic pain syndrome Due to moderate OA of lower back. Referral to specialty placed. Patient is very adamant about having Oxycodone. He is aware that with his history of substance abuse and current abuse of alcohol, we will not be prescribing any controlled substances as that would be negligent. Will attempt trial of Gabapentin and Flexeril to help with pain while getting him in with specialist.   Alcohol  use disorder (HCC) Discussed detrimental effects on health of alcohol overuse.  Discussed need for inpatient care for this due to his prior history of withdrawals.  He was given number for [facility] to start a program. He agrees to do so.

## 2015-11-23 NOTE — Telephone Encounter (Signed)
Called patient and gave him the number below to [facility]. Patient states that he will give them a call.

## 2015-11-25 ENCOUNTER — Encounter: Payer: Self-pay | Admitting: Physician Assistant

## 2015-11-25 DIAGNOSIS — Z125 Encounter for screening for malignant neoplasm of prostate: Secondary | ICD-10-CM | POA: Insufficient documentation

## 2015-11-25 DIAGNOSIS — Z0000 Encounter for general adult medical examination without abnormal findings: Secondary | ICD-10-CM | POA: Insufficient documentation

## 2015-11-25 DIAGNOSIS — G894 Chronic pain syndrome: Secondary | ICD-10-CM | POA: Insufficient documentation

## 2015-11-25 NOTE — Assessment & Plan Note (Signed)
The natural history of prostate cancer and ongoing controversy regarding screening and potential treatment outcomes of prostate cancer has been discussed with the patient. The meaning of a false positive PSA and a false negative PSA has been discussed. He indicates understanding of the limitations of this screening test and wishes  to proceed with screening PSA testing.  

## 2015-11-25 NOTE — Assessment & Plan Note (Signed)
Depression screen negative. Health Maintenance reviewed -- Due for colonoscopy. Refuses but would like Cologuard. He will check coverage with his insurance.  Preventive schedule discussed and handout given in AVS. Will obtain fasting labs today.

## 2015-11-25 NOTE — Assessment & Plan Note (Signed)
Due to moderate OA of lower back. Referral to specialty placed. Patient is very adamant about having Oxycodone. He is aware that with his history of substance abuse and current abuse of alcohol, we will not be prescribing any controlled substances as that would be negligent. Will attempt trial of Gabapentin and Flexeril to help with pain while getting him in with specialist.

## 2015-11-25 NOTE — Assessment & Plan Note (Signed)
Discussed detrimental effects on health of alcohol overuse.  Discussed need for inpatient care for this due to his prior history of withdrawals.  He was given number for [facility] to start a program. He agrees to do so.

## 2015-11-26 ENCOUNTER — Telehealth: Payer: Self-pay | Admitting: Physician Assistant

## 2015-11-26 NOTE — Telephone Encounter (Signed)
Spoke with patient/pt's wife and she states that patient was given list with types of medications, with "long list of medications" under each type; the types are as follows: mood stabilizers, atypical antipsychotics, antidepressants, anti-anxiety agents, sleep aids, anti-parkinson's. We will call facility on Monday for further information; provider is aware and patient was told that it will be MOnday before we will have a provider response, understood & agreed/SLS 04/28

## 2015-11-26 NOTE — Telephone Encounter (Signed)
Relation to ZO:XWRUpt:self Call back number:[phone] [id number] - [place] - [address] [facility] [address] [phone] (Phone) [phone] (Fax)         Reason for call:  Patient was seen by Behavior Health today and gave serveral suggestions of medications. Patient was unaware of the name of the dr. [facility].

## 2015-11-29 MED ORDER — METFORMIN HCL 500 MG PO TABS: 500.0000 mg | ORAL_TABLET | Freq: Every day | ORAL | Status: DC

## 2015-11-29 MED ORDER — MELATONIN 5 MG PO CAPS: 5.0000 mg | ORAL_CAPSULE | Freq: Every day | ORAL | Status: DC

## 2015-11-29 MED ORDER — KRILL OIL 1000 MG PO CAPS: 1000.0000 mg | ORAL_CAPSULE | Freq: Every day | ORAL | Status: AC

## 2015-11-29 NOTE — Telephone Encounter (Signed)
Notes Recorded by Verdie ShireAngela M Baynes, CMA on 11/24/2015 at 8:11 PM [facility] @ 8:10pm @ [phone]) asking the pt to RTC regarding lab results.//AB/CMA Notes Recorded by Waldon MerlWilliam C Martin, PA-C on 11/24/2015 at 7:51 AM Lab results are in:  (1) Blood count, thyroid function and prostate screen all look good. (2) His A1C is at 8.1, making him diabetic. Need to get these levels down. Alcohol is a big contributor so cessation is key. Would like to start Metformin 500 mg daily. Ok to send in Rx. (3) Cholesterol is just borderline high but TGL are high. Need to start medication but I am hesitant to start a statin due to his alcohol use. Can start OTC fish oil for now and we can reassess starting Rx medication once he has finished his alcohol program.  FU with me in 1 month.

## 2015-11-29 NOTE — Telephone Encounter (Signed)
Relationship to patient: self Can be reached: [phone] [id number] - [place], [place] - [address] [facility] [address]  Reason for call: pt states that he is waiting for f/u about meds and lab results and needs some answers. Pt requesting call back today.

## 2015-11-29 NOTE — Telephone Encounter (Addendum)
Patient informed, understood & agreed; new Rx to pharmacy, pt will use Krill Oil and will purchase Melatonin supplement to help with sleep until we can speak with Behavioral Health/[facility] to discuss non-narcotic options for prescribing for Anxiety and/or sleep/SLS 05/01

## 2015-11-29 NOTE — Addendum Note (Signed)
Addended by: Regis BillSCATES, SHARON L on: 11/29/2015 05:23 PM   Modules accepted: Orders

## 2015-11-30 ENCOUNTER — Other Ambulatory Visit: Payer: Self-pay | Admitting: Physician Assistant

## 2015-11-30 ENCOUNTER — Encounter: Payer: BLUE CROSS/BLUE SHIELD | Admitting: Physician Assistant

## 2015-11-30 NOTE — Telephone Encounter (Signed)
Rx request to pharmacy/SLS  

## 2015-11-30 NOTE — Telephone Encounter (Signed)
Spoke with Inpatient services, who transferred me to Outpatient services, who then transferred me back to Inpatient services, as patient had brief recent Inpatient stay. Informed them of patient's call and information given about receiving list of medications suggested by their facility to be prescribed for patient by PCP, but need to discuss this list, as patient has Hx of drug & alcohol abuse and cannot receive any controlled substances from our clinic. Awaiting call back/SLS 05/02

## 2015-12-06 NOTE — Telephone Encounter (Signed)
Please reach out again to see what is needed or at least to get records faxed to us.

## 2015-12-28 ENCOUNTER — Encounter: Payer: Self-pay | Admitting: *Deleted

## 2015-12-30 ENCOUNTER — Ambulatory Visit: Payer: BLUE CROSS/BLUE SHIELD | Admitting: Medical

## 2016-01-03 ENCOUNTER — Other Ambulatory Visit: Payer: Self-pay

## 2016-01-03 MED ORDER — DICLOFENAC POTASSIUM 50 MG PO TABS: ORAL_TABLET | ORAL | Status: DC

## 2016-01-04 ENCOUNTER — Encounter: Payer: Self-pay | Admitting: Physician Assistant

## 2016-01-04 ENCOUNTER — Ambulatory Visit (INDEPENDENT_AMBULATORY_CARE_PROVIDER_SITE_OTHER): Payer: BLUE CROSS/BLUE SHIELD | Admitting: Physician Assistant

## 2016-01-04 VITALS — BP 146/102 | HR 86 | Temp 98.1°F | Resp 16 | Ht 67.0 in | Wt 208.5 lb

## 2016-01-04 DIAGNOSIS — H109 Unspecified conjunctivitis: Secondary | ICD-10-CM | POA: Diagnosis not present

## 2016-01-04 DIAGNOSIS — R030 Elevated blood-pressure reading, without diagnosis of hypertension: Secondary | ICD-10-CM | POA: Diagnosis not present

## 2016-01-04 DIAGNOSIS — IMO0001 Reserved for inherently not codable concepts without codable children: Secondary | ICD-10-CM

## 2016-01-04 NOTE — Progress Notes (Signed)
Patient presents to clinic today for UC follow-up of conjunctivitis. Was placed on Cefdinir and Topramycin drops. Endorses using as directed. Still has a couple days left of Cefdinir. Has finished drops. Denies residual symptoms -- redness, drainage, crusting pain. Denies vision changes. Denies fever, chills.  Endorses doing well.   Past Medical History  Diagnosis Date  . Pancreatitis 2014  . Arthritis   . Depression   . History of eating disorder   . Hypertension     Current Outpatient Prescriptions on File Prior to Visit  Medication Sig Dispense Refill  . cyclobenzaprine (FLEXERIL) 5 MG tablet Take 1 tablet (5 mg total) by mouth at bedtime. 30 tablet 1  . diclofenac (CATAFLAM) 50 MG tablet TAKE 1 TABLET(50 MG) BY MOUTH TWICE DAILY 60 tablet 0  . fluticasone (FLONASE) 50 MCG/ACT nasal spray USE 2 SPRAYS IN EACH SIDE DAILY  2  . gabapentin (NEURONTIN) 100 MG capsule Take 1 capsule (100 mg total) by mouth 2 (two) times daily. 90 capsule 3  . ketorolac (TORADOL) 10 MG tablet TK 1 T PO  Q 6 H PRN P  0  . Krill Oil 1000 MG CAPS Take 1 capsule (1,000 mg total) by mouth daily.    Marland Kitchen loratadine (CLARITIN) 10 MG tablet Take 10 mg by mouth daily as needed for allergies.    . Melatonin ([employer] MELATONIN) 5 MG CAPS Take 1 capsule (5 mg total) by mouth at bedtime.  0  . metFORMIN (GLUCOPHAGE) 500 MG tablet Take 1 tablet (500 mg total) by mouth daily with breakfast. 30 tablet 3  . Multiple Vitamins-Minerals (MULTIVITAMIN ADULT PO) Take 1 tablet by mouth daily.    . ondansetron (ZOFRAN) 4 MG tablet Take 1 tablet (4 mg total) by mouth every 8 (eight) hours as needed for nausea or vomiting. 20 tablet 0  . Oxymetazoline HCl (NASAL SPRAY NA) Place 1 spray into both nostrils daily. PRN Allergies    . varenicline (CHANTIX PAK) 0.5 MG X 11 & 1 MG X 42 tablet Take one 0.5 mg tablet by mouth once daily for 3 days, then increase to one 0.5 mg tablet twice daily for 4 days, then increase to one 1 mg tablet twice  daily. (Patient not taking: Reported on 11/22/2015) 53 tablet 0   No current facility-administered medications on file prior to visit.    No Known Allergies  Family History  Problem Relation Age of Onset  . Hypertension Mother   . Arthritis Mother   . Cancer Father     Colon    Social History   Social History  . Marital Status: Single    Spouse Name: N/A  . Number of Children: 6  . Years of Education: N/A   Social History Main Topics  . Smoking status: Current Every Day Smoker -- 1.00 packs/day    Types: Cigarettes    Start date: 11/02/1995  . Smokeless tobacco: Never Used  . Alcohol Use: 0.0 oz/week    0 Standard drinks or equivalent per week  . Drug Use: No  . Sexual Activity: Not Asked   Other Topics Concern  . None   Social History Narrative   Review of Systems - See HPI.  All other ROS are negative.  BP 152/98 mmHg  Pulse 86  Temp(Src) 98.1 F (36.7 C) (Oral)  Resp 16  Ht  (1.702 m)  Wt 208 lb 8 oz (94.575 kg)  BMI 32.65 kg/m2  SpO2 97%  Physical Exam  Constitutional:  He is oriented to person, place, and time and well-developed, well-nourished, and in no distress.  HENT:  Head: Normocephalic and atraumatic.  Right Ear: Tympanic membrane normal.  Left Ear: Tympanic membrane normal.  Eyes: Conjunctivae and EOM are normal. Pupils are equal, round, and reactive to light.  Cardiovascular: Normal rate, regular rhythm, normal heart sounds and intact distal pulses.   Pulmonary/Chest: Effort normal and breath sounds normal. No respiratory distress. He has no wheezes. He has no rales. He exhibits no tenderness.  Neurological: He is alert and oriented to person, place, and time.  Skin: Skin is warm and dry. No rash noted.  Psychiatric: Affect normal.  Vitals reviewed.  Recent Results (from the past 2160 hour(s))  CBC     Status: None   Collection Time: 11/22/15  9:38 AM  Result Value Ref Range   WBC 7.2 4.0 - 10.5 K/uL   RBC 4.32 4.22 - 5.81 Mil/uL     Platelets 343.0 150.0 - 400.0 K/uL   Hemoglobin 14.2 13.0 - 17.0 g/dL   HCT 53.642.0 64.439.0 - 03.452.0 %   MCV 97.3 78.0 - 100.0 fl   MCHC 33.7 30.0 - 36.0 g/dL   RDW 74.212.2 59.511.5 - 63.815.5 %  Comprehensive metabolic panel     Status: Abnormal   Collection Time: 11/22/15  9:38 AM  Result Value Ref Range   Sodium 138 135 - 145 mEq/L   Potassium 4.2 3.5 - 5.1 mEq/L   Chloride 97 96 - 112 mEq/L   CO2 32 19 - 32 mEq/L   Glucose, Bld 190 (H) 70 - 99 mg/dL   BUN 4 (L) 6 - 23 mg/dL   Creatinine, Ser 7.560.79 0.40 - 1.50 mg/dL   Total Bilirubin 0.3 0.2 - 1.2 mg/dL   Alkaline Phosphatase 58 39 - 117 U/L   AST 26 0 - 37 U/L   ALT 17 0 - 53 U/L   Total Protein 6.5 6.0 - 8.3 g/dL   Albumin 4.3 3.5 - 5.2 g/dL   Calcium 9.8 8.4 - 43.310.5 mg/dL   GFR 295.18132.11 >84.16>60.00 mL/min  Lipid panel     Status: Abnormal   Collection Time: 11/22/15  9:38 AM  Result Value Ref Range   Cholesterol 201 (H) 0 - 200 mg/dL    Comment: ATP III Classification       Desirable:  < 200 mg/dL               Borderline High:  200 - 239 mg/dL          High:  > = 606240 mg/dL   Triglycerides 301.6307.0 (H) 0.0 - 149.0 mg/dL    Comment: Normal:  <010<150 mg/dLBorderline High:  150 - 199 mg/dL   HDL 93.2367.10 >55.73>39.00 mg/dL   VLDL 22.061.4 (H) 0.0 - 25.440.0 mg/dL   Total CHOL/HDL Ratio 3     Comment:                Men          Women1/2 Average Risk     3.4          3.3Average Risk          5.0          4.42X Average Risk          9.6          7.13X Average Risk          15.0          11.0  NonHDL 134.13     Comment: NOTE:  Non-HDL goal should be 30 mg/dL higher than patient's LDL goal (i.e. LDL goal of < 70 mg/dL, would have non-HDL goal of < 100 mg/dL)  Hemoglobin Z6X     Status: Abnormal   Collection Time: 11/22/15  9:38 AM  Result Value Ref Range   Hgb A1c MFr Bld 8.1 (H) 4.6 - 6.5 %    Comment: Glycemic Control Guidelines for People with Diabetes:Non Diabetic:  <6%Goal of Therapy: <7%Additional Action Suggested:  >8%   PSA     Status: None    Collection Time: 11/22/15  9:38 AM  Result Value Ref Range   PSA 0.44 0.10 - 4.00 ng/mL  TSH     Status: None   Collection Time: 11/22/15  9:38 AM  Result Value Ref Range   TSH 0.94 0.35 - 4.50 uIU/mL  Urinalysis, Routine w reflex microscopic (not at [facility])     Status: Abnormal   Collection Time: 11/22/15  9:38 AM  Result Value Ref Range   Color, Urine YELLOW Yellow;Lt. Yellow   APPearance CLEAR Clear   Specific Gravity, Urine 1.010 1.000-1.030   pH 7.5 5.0 - 8.0   Total Protein, Urine NEGATIVE Negative   Urine Glucose 100 (A) Negative   Ketones, ur NEGATIVE Negative   Bilirubin Urine NEGATIVE Negative   Hgb urine dipstick NEGATIVE Negative   Urobilinogen, UA 0.2 0.0 - 1.0   Leukocytes, UA NEGATIVE Negative   Nitrite NEGATIVE Negative   WBC, UA none seen 0-2/hpf   RBC / HPF none seen 0-2/hpf   Squamous Epithelial / LPF Rare(0-4/hpf) Rare(0-4/hpf)  LDL cholesterol, direct     Status: None   Collection Time: 11/22/15  9:38 AM  Result Value Ref Range   Direct LDL 102.0 mg/dL    Comment: Optimal:  <096 mg/dLNear or Above Optimal:  100-129 mg/dLBorderline High:  130-159 mg/dLHigh:  160-189 mg/dLVery High:  >190 mg/dL    Assessment/Plan: 1. Conjunctivitis, left eye Resolved. Patient to finish entire course of oral antibiotic prescribed. Supportive measures reviewed. Follow-up PRN.  2. Elevated BP Still elevated on recheck Asymptomatic. DASH diet discussed. BP recheck 2 weeks. If still elevated will start medication.

## 2016-01-04 NOTE — Progress Notes (Signed)
Pre visit review using our clinic review tool, if applicable. No additional management support is needed unless otherwise documented below in the visit note/SLS  

## 2016-01-04 NOTE — Telephone Encounter (Addendum)
Patient has scheduled appointment for 01/04/16; will discuss medication information since we [facility] get [facility]SLS

## 2016-01-04 NOTE — Patient Instructions (Addendum)
Your eye looks good today. Finish the entire course of the oral antibiotic. Avoid touching the eyes.   Keep hands washed, especially at work. Call or come see me if any symptoms recur.  Follow the regimen below to help with BP. Follow-up with nurse in 2 weeks for a BP check. If still elevated we will need to start medication.  DASH Eating Plan DASH stands for "Dietary Approaches to Stop Hypertension." The DASH eating plan is a healthy eating plan that has been shown to reduce high blood pressure (hypertension). Additional health benefits may include reducing the risk of type 2 diabetes mellitus, heart disease, and stroke. The DASH eating plan may also help with weight loss. WHAT DO I NEED TO KNOW ABOUT THE DASH EATING PLAN? For the DASH eating plan, you will follow these general guidelines:  Choose foods with a percent daily value for sodium of less than 5% (as listed on the food label).  Use salt-free seasonings or herbs instead of table salt or sea salt.  Check with your health care provider or pharmacist before using salt substitutes.  Eat lower-sodium products, often labeled as "lower sodium" or "no salt added."  Eat fresh foods.  Eat more vegetables, fruits, and low-fat dairy products.  Choose whole grains. Look for the word "whole" as the first word in the ingredient list.  Choose fish and skinless chicken or Malawiturkey more often than red meat. Limit fish, poultry, and meat to 6 oz (170 g) each day.  Limit sweets, desserts, sugars, and sugary drinks.  Choose heart-healthy fats.  Limit cheese to 1 oz (28 g) per day.  Eat more home-cooked food and less restaurant, buffet, and fast food.  Limit fried foods.  Cook foods using methods other than frying.  Limit canned vegetables. If you do use them, rinse them well to decrease the sodium.  When eating at a restaurant, ask that your food be prepared with less salt, or no salt if possible. WHAT FOODS CAN I EAT? Seek help from  a dietitian for individual calorie needs. Grains Whole grain or whole wheat bread. Brown rice. Whole grain or whole wheat pasta. Quinoa, bulgur, and whole grain cereals. Low-sodium cereals. Corn or whole wheat flour tortillas. Whole grain cornbread. Whole grain crackers. Low-sodium crackers. Vegetables Fresh or frozen vegetables (raw, steamed, roasted, or grilled). Low-sodium or reduced-sodium tomato and vegetable juices. Low-sodium or reduced-sodium tomato sauce and paste. Low-sodium or reduced-sodium canned vegetables.  Fruits All fresh, canned (in natural juice), or frozen fruits. Meat and Other Protein Products Ground beef (85% or leaner), grass-fed beef, or beef trimmed of fat. Skinless chicken or Malaw[place]urkey. Ground chicken or Mal[place]urkey. Pork trimmed of fat. All fish and seafood. Eggs. Dried beans, peas, or lentils. Unsalted nuts and seeds. Unsalted canned beans. Dairy Low-fat dairy products, such as skim or 1% milk, 2% or reduced-fat cheeses, low-fat ricotta or cottage cheese, or plain low-fat yogurt. Low-sodium or reduced-sodium cheeses. Fats and Oils Tub margarines without trans fats. Light or reduced-fat mayonnaise and salad dressings (reduced sodium). Avocado. Safflower, olive, or canola oils. Natural peanut or almond butter. Other Unsalted popcorn and pretzels. The items listed above may not be a complete list of recommended foods or beverages. Contact your dietitian for more options. WHAT FOODS ARE NOT RECOMMENDED? Grains White bread. White pasta. White rice. Refined cornbread. Bagels and croissants. Crackers that contain trans fat. Vegetables Creamed or fried vegetables. Vegetables in a cheese sauce. Regular canned vegetables. Regular canned tomato sauce and paste. Regular tomato  and vegetable juices. Fruits Dried fruits. Canned fruit in light or heavy syrup. Fruit juice. Meat and Other Protein Products Fatty cuts of meat. Ribs, chicken wings, bacon, sausage, bologna, salami,  chitterlings, fatback, hot dogs, bratwurst, and packaged luncheon meats. Salted nuts and seeds. Canned beans with salt. Dairy Whole or 2% milk, cream, half-and-half, and cream cheese. Whole-fat or sweetened yogurt. Full-fat cheeses or blue cheese. Nondairy creamers and whipped toppings. Processed cheese, cheese spreads, or cheese curds. Condiments Onion and garlic salt, seasoned salt, table salt, and sea salt. Canned and packaged gravies. Worcestershire sauce. Tartar sauce. Barbecue sauce. Teriyaki sauce. Soy sauce, including reduced sodium. Steak sauce. Fish sauce. Oyster sauce. Cocktail sauce. Horseradish. Ketchup and mustard. Meat flavorings and tenderizers. Bouillon cubes. Hot sauce. Tabasco sauce. Marinades. Taco seasonings. Relishes. Fats and Oils Butter, stick margarine, lard, shortening, ghee, and bacon fat. Coconut, palm kernel, or palm oils. Regular salad dressings. Other Pickles and olives. Salted popcorn and pretzels. The items listed above may not be a complete list of foods and beverages to avoid. Contact your dietitian for more information. WHERE CAN I FIND MORE INFORMATION? National Heart, Lung, and Blood Institute: travelstabloid.com   This information is not intended to replace advice given to you by your health care provider. Make sure you discuss any questions you have with your health care provider.   Document Released: 07/06/2011 Document Revised: 08/07/2014 Document Reviewed: 05/21/2013 Elsevier Interactive Patient Education [employer].

## 2016-01-22 ENCOUNTER — Other Ambulatory Visit: Payer: Self-pay | Admitting: Physician Assistant

## 2016-01-29 ENCOUNTER — Other Ambulatory Visit: Payer: Self-pay | Admitting: Physician Assistant

## 2016-01-31 NOTE — Telephone Encounter (Signed)
Refill sent per [facility] refill protocol/SLS  

## 2016-02-22 ENCOUNTER — Ambulatory Visit (INDEPENDENT_AMBULATORY_CARE_PROVIDER_SITE_OTHER): Payer: BLUE CROSS/BLUE SHIELD | Admitting: Physician Assistant

## 2016-02-22 ENCOUNTER — Encounter: Payer: Self-pay | Admitting: Physician Assistant

## 2016-02-22 VITALS — BP 136/94 | HR 85 | Temp 98.1°F | Resp 16 | Ht 67.0 in | Wt 204.4 lb

## 2016-02-22 DIAGNOSIS — G894 Chronic pain syndrome: Secondary | ICD-10-CM

## 2016-02-22 DIAGNOSIS — K852 Alcohol induced acute pancreatitis without necrosis or infection: Secondary | ICD-10-CM

## 2016-02-22 DIAGNOSIS — E1165 Type 2 diabetes mellitus with hyperglycemia: Secondary | ICD-10-CM

## 2016-02-22 DIAGNOSIS — Z23 Encounter for immunization: Secondary | ICD-10-CM

## 2016-02-22 DIAGNOSIS — E118 Type 2 diabetes mellitus with unspecified complications: Secondary | ICD-10-CM | POA: Diagnosis not present

## 2016-02-22 DIAGNOSIS — F17200 Nicotine dependence, unspecified, uncomplicated: Secondary | ICD-10-CM | POA: Diagnosis not present

## 2016-02-22 DIAGNOSIS — IMO0002 Reserved for concepts with insufficient information to code with codable children: Secondary | ICD-10-CM

## 2016-02-22 LAB — COMPREHENSIVE METABOLIC PANEL
ALT: 21 U/L (ref 0–53)
AST: 30 U/L (ref 0–37)
Albumin: 4.7 g/dL (ref 3.5–5.2)
Alkaline Phosphatase: 58 U/L (ref 39–117)
BILIRUBIN TOTAL: 0.3 mg/dL (ref 0.2–1.2)
BUN: 7 mg/dL (ref 6–23)
CO2: 29 meq/L (ref 19–32)
Calcium: 9.9 mg/dL (ref 8.4–10.5)
Chloride: 99 mEq/L (ref 96–112)
Creatinine, Ser: 0.81 mg/dL (ref 0.40–1.50)
GFR: 128.23 mL/min (ref >60.00)
GLUCOSE: 197 mg/dL — AB (ref 70–99)
Potassium: 3.4 mEq/L — ABNORMAL LOW (ref 3.5–5.1)
SODIUM: 136 meq/L (ref 135–145)
Total Protein: 7.3 g/dL (ref 6.0–8.3)

## 2016-02-22 LAB — HEMOGLOBIN A1C: Hgb A1c MFr Bld: 9.4 % — ABNORMAL HIGH (ref 4.6–6.5)

## 2016-02-22 LAB — MICROALBUMIN / CREATININE URINE RATIO
CREATININE, U: 107.4 mg/dL
Microalb Creat Ratio: 1 mg/g (ref 0.0–30.0)
Microalb, Ur: 1.1 mg/dL (ref 0.0–1.9)

## 2016-02-22 MED ORDER — GABAPENTIN 100 MG PO CAPS: 100.0000 mg | ORAL_CAPSULE | Freq: Three times a day (TID) | ORAL | 3 refills | Status: DC

## 2016-02-22 MED ORDER — METFORMIN HCL 500 MG PO TABS: 500.0000 mg | ORAL_TABLET | Freq: Two times a day (BID) | ORAL | 3 refills | Status: DC

## 2016-02-22 NOTE — Progress Notes (Signed)
Patient presents to clinic today for hospital follow-up of alcoholic pancreatitis. Patient was admitted to [facility] on 02/08/16 after presenting to ER c/o acute epigastric abdominal pain. CT revealed pancreatic inflammation without pseudocyst or necrosis. Patient placed NPO and given IV fluids for hydration. As pain improved, patient was switched to clear liquids initially before resuming low fat diabetic diet. During admission, patient was noted to have elevated BP readings. Was discharged on amlodipine 10 mg daily. Was encouraged to follow-up closely with PCP for further management.  Since discharge, patient denies recurrence of pain. Denies fever, chills, nausea or vomiting. Has been taking his Metformin 500 mg QD as directed. Has not been checking fasting sugars. Has not taken amlodipine. Patient denies chest pain, palpitations, lightheadedness, dizziness, vision changes or frequent headaches. Is cutting back on alcohol, now with 1 beer per day. States he is having difficulty with complete cessation but does not want medication or inpatient assessment. Is moving [place] [facility] [place] this weekend. Would like to start with counselor in the area as he feels the alcohol is filling a void in his life. Denies depressed mood, SI/HI.   Patient previously given Chantix for smoking cessation. Continues to smoke and has not taken Chantx as he is not ready to quit. Denies cough, hemoptysis, SOB.  Past Medical History:  Diagnosis Date  . Arthritis   . Depression   . History of eating disorder   . Hypertension   . Pancreatitis 2014    Current Outpatient Prescriptions on File Prior to Visit  Medication Sig Dispense Refill  . cyclobenzaprine (FLEXERIL) 5 MG tablet TAKE 1 TABLET(5 MG) BY MOUTH AT BEDTIME 30 tablet 0  . diclofenac (CATAFLAM) 50 MG tablet TAKE 1 TABLET(50 MG) BY MOUTH TWICE DAILY 60 tablet 0  . fluticasone (FLONASE) 50 MCG/ACT nasal spray USE 2 SPRAYS IN EACH SIDE DAILY  2  . ketorolac  (TORADOL) 10 MG tablet TK 1 T PO  Q 6 H PRN P  0  . Krill Oil 1000 MG CAPS Take 1 capsule (1,000 mg total) by mouth daily.    Marland Kitchen loratadine (CLARITIN) 10 MG tablet Take 10 mg by mouth daily as needed for allergies.    . Multiple Vitamins-Minerals (MULTIVITAMIN ADULT PO) Take 1 tablet by mouth daily.    . varenicline (CHANTIX PAK) 0.5 MG X 11 & 1 MG X 42 tablet Take one 0.5 mg tablet by mouth once daily for 3 days, then increase to one 0.5 mg tablet twice daily for 4 days, then increase to one 1 mg tablet twice daily. (Patient not taking: Reported on 11/22/2015) 53 tablet 0   No current facility-administered medications on file prior to visit.     No Known Allergies  Family History  Problem Relation Age of Onset  . Hypertension Mother   . Arthritis Mother   . Cancer Father     Colon    Social History   Social History  . Marital status: Single    Spouse name: N/A  . Number of children: 6  . Years of education: N/A   Social History Main Topics  . Smoking status: Current Every Day Smoker    Packs/day: 1.00    Types: Cigarettes    Start date: 11/02/1995  . Smokeless tobacco: Never Used  . Alcohol use 0.0 oz/week  . Drug use: No  . Sexual activity: Not Asked   Other Topics Concern  . None   Social History Narrative  . None  Review of Systems - See HPI.  All other ROS are negative.  BP (!) 136/94 (BP Location: Left Arm, Patient Position: Sitting, Cuff Size: Large)   Pulse 85   Temp 98.1 F (36.7 C) (Oral)   Resp 16   Ht 5\' 7"  (1.702 m)   Wt 204 lb 6 oz (92.7 kg)   SpO2 98%   BMI 32.01 kg/m   Physical Exam  Constitutional: He is oriented to person, place, and time and well-developed, well-nourished, and in no distress.  HENT:  Head: Normocephalic and atraumatic.  Eyes: Conjunctivae are normal. Pupils are equal, round, and reactive to light.  Neck: Neck supple.  Cardiovascular: Normal rate, regular rhythm, normal heart sounds and intact distal pulses.     Pulmonary/Chest: Effort normal and breath sounds normal. No respiratory distress. He has no wheezes. He has no rales. He exhibits no tenderness.  Abdominal: Soft. Bowel sounds are normal. He exhibits no distension. There is no tenderness.  Neurological: He is alert and oriented to person, place, and time.  Skin: Skin is warm and dry. No rash noted.  Psychiatric: Affect normal.  Vitals reviewed.  No results found for this or any previous visit (from the past 2160 hour(s)).  Assessment/Plan: Tobacco use disorder Not ready to quit. Patient instructed on risks of continued smoking to include -- Hypertension, Increased risk for CAD, MI, CVA, urological, nephrological and pulmonary cancers and chronic diseases. He will give some thought to restarting Chantix.  Acute alcoholic pancreatitis Clinically resolved. High risk of recurrence due to alcohol consumption. Has weaned to 1 beer per day but complete cessation advised. Refuses rehab programs and AA. Wants to do this on his own. Will refer to psychology/counseling services in [facility] as he is moving there this weekend. Discussed if he notes difficulty weaning further to reconsider medications or other support.  Uncontrolled type 2 diabetes mellitus with complication, without long-term current use of insulin (HCC) Due for repeat labs - BMP, A1C and urine microalbumin. Will increase metformin to 500 mg BID. Diet and exercise reviewed. He is to use Glucometer given in hospital to check sugars (fasting) daily and record. Goals discussed. BP mildly elevated. Is not taking BP medication. DASH diet reviewed. Will check renal function today and urine microalbumin. Wills start ACEI based on results. He is to establish with new PCP in the [facility] area.    Piedad Climes, PA-C

## 2016-02-22 NOTE — Progress Notes (Signed)
Pre visit review using our clinic review tool, if applicable. No additional management support is needed unless otherwise documented below in the visit note/SLS  

## 2016-02-22 NOTE — Patient Instructions (Signed)
Please go to the lab for blood work. I will call with results.  If your electrolytes looks good we will start a low-dose lisinopril to help with BP and protect kidneys from sugars. Increase Metformin to 1 tablet twice daily. A new prescription has been sent in. Continue all other medicines as directed.  Please set up an appointment with a new PCP in [facility]. It is very important you have ongoing care. I will get you set up with a counselor in that area to work further on alcohol cessation and to discover what the underlying root of drinking is.  If you develop any recurrence of symptoms, please go to the ER.  Good luck in [facility]!

## 2016-02-22 NOTE — Assessment & Plan Note (Signed)
Clinically resolved. High risk of recurrence due to alcohol consumption. Has weaned to 1 beer per day but complete cessation advised. Refuses rehab programs and AA. Wants to do this on his own. Will refer to psychology/counseling services in [facility] as he is moving there this weekend. Discussed if he notes difficulty weaning further to reconsider medications or other support.

## 2016-02-22 NOTE — Assessment & Plan Note (Signed)
Not ready to quit. Patient instructed on risks of continued smoking to include -- Hypertension, Increased risk for CAD, MI, CVA, urological, nephrological and pulmonary cancers and chronic diseases. He will give some thought to restarting Chantix.

## 2016-02-22 NOTE — Assessment & Plan Note (Signed)
Due for repeat labs - BMP, A1C and urine microalbumin. Will increase metformin to 500 mg BID. Diet and exercise reviewed. He is to use Glucometer given in hospital to check sugars (fasting) daily and record. Goals discussed. BP mildly elevated. Is not taking BP medication. DASH diet reviewed. Will check renal function today and urine microalbumin. Wills start ACEI based on results. He is to establish with new PCP in the [facility] area.

## 2016-02-23 ENCOUNTER — Other Ambulatory Visit: Payer: Self-pay | Admitting: Physician Assistant

## 2016-02-23 MED ORDER — VITAMIN D (ERGOCALCIFEROL) 1.25 MG (50000 UNIT) PO CAPS: 50000.0000 [IU] | ORAL_CAPSULE | ORAL | 0 refills | Status: DC

## 2016-02-23 MED ORDER — LISINOPRIL 5 MG PO TABS: 5.0000 mg | ORAL_TABLET | Freq: Every day | ORAL | 1 refills | Status: DC

## 2016-02-25 ENCOUNTER — Other Ambulatory Visit: Payer: Self-pay | Admitting: Physician Assistant

## 2016-02-25 NOTE — Telephone Encounter (Signed)
eScribe request from [facility] for refill on Cyclobenzaprine Last filled - 01/22/16, #30x0 Last AEX - 02/22/16 Rx request to pharmacy, Ok per provider VO/SLS

## 2016-02-29 ENCOUNTER — Other Ambulatory Visit: Payer: Self-pay | Admitting: Physician Assistant

## 2016-03-07 ENCOUNTER — Other Ambulatory Visit: Payer: Self-pay | Admitting: Physician Assistant

## 2016-03-09 LAB — HM DIABETES EYE EXAM

## 2016-03-15 ENCOUNTER — Encounter: Payer: Self-pay | Admitting: Physician Assistant

## 2016-03-15 NOTE — Progress Notes (Unsigned)
Pre visit review using our clinic review tool, if applicable. No additional management support is needed unless otherwise documented below in the visit note/SLS  

## 2016-03-22 ENCOUNTER — Other Ambulatory Visit: Payer: Self-pay | Admitting: Physician Assistant

## 2016-03-22 DIAGNOSIS — G894 Chronic pain syndrome: Secondary | ICD-10-CM

## 2016-03-22 NOTE — Telephone Encounter (Signed)
Rx request Mitchell RumpfDenied-Too Soon-Last Rx to pharmacy 02/24/16; 30-day supply with 3 refills each/SLS 03/22/16

## 2016-03-26 ENCOUNTER — Other Ambulatory Visit: Payer: Self-pay | Admitting: Physician Assistant

## 2016-03-27 NOTE — Telephone Encounter (Signed)
Rx request to pharmacy/SLS  

## 2016-04-05 ENCOUNTER — Other Ambulatory Visit: Payer: Self-pay | Admitting: Physician Assistant

## 2016-04-05 NOTE — Telephone Encounter (Signed)
Last refill on 02/29/16  #60 with 0 refills Last office visit on 02/22/2016

## 2016-04-08 ENCOUNTER — Other Ambulatory Visit: Payer: Self-pay | Admitting: Physician Assistant

## 2016-05-04 ENCOUNTER — Other Ambulatory Visit: Payer: Self-pay | Admitting: Physician Assistant

## 2016-05-20 ENCOUNTER — Other Ambulatory Visit: Payer: Self-pay | Admitting: Physician Assistant

## 2016-05-25 ENCOUNTER — Other Ambulatory Visit: Payer: Self-pay | Admitting: Physician Assistant

## 2016-05-25 DIAGNOSIS — IMO0002 Reserved for concepts with insufficient information to code with codable children: Secondary | ICD-10-CM

## 2016-05-25 DIAGNOSIS — E118 Type 2 diabetes mellitus with unspecified complications: Principal | ICD-10-CM

## 2016-05-25 DIAGNOSIS — E1165 Type 2 diabetes mellitus with hyperglycemia: Secondary | ICD-10-CM

## 2016-05-26 NOTE — Telephone Encounter (Signed)
Rx request to pharmacy/SLS  

## 2016-06-02 ENCOUNTER — Other Ambulatory Visit: Payer: Self-pay | Admitting: Physician Assistant

## 2016-06-02 ENCOUNTER — Telehealth: Payer: Self-pay | Admitting: Physician Assistant

## 2016-06-02 NOTE — Telephone Encounter (Signed)
Rx request Declined per provider, as patient is suppose to have moved out of town/SLS 11/03

## 2016-06-05 NOTE — Telephone Encounter (Signed)
Please advise. TL/CMA 

## 2016-06-12 MED ORDER — DICLOFENAC POTASSIUM 50 MG PO TABS: ORAL_TABLET | ORAL | 0 refills | Status: DC

## 2016-06-12 NOTE — Telephone Encounter (Signed)
Pt's spouse Coreen called in because she says that they have moved out  Of town and is having to wait until January to be established with a new pcp. She would like to know if provider is able to refill Rx until? She says that they are in a dilemma and pt is out of his medication. I advised that an office visit is needed. She stressed again that they have moved. Informed that I will send provider a message to advise.    If so, [facility] - phone: [phone]   [address] [place] [phone]

## 2016-06-12 NOTE — Telephone Encounter (Signed)
I will allow quantity 15 but he would need to be seen for reassessment to continue.

## 2016-06-12 NOTE — Telephone Encounter (Signed)
Quantity of 15 sent to requested pharmacy; Patient informed, understood & agreed to call provider's new office [phone number given] and schedule f/u appointment/SLS 11/13

## 2016-06-12 NOTE — Addendum Note (Signed)
Addended by: Regis BillSCATES, Joash Tony L on: 06/12/2016 02:01 PM   Modules accepted: Orders

## 2016-06-16 ENCOUNTER — Other Ambulatory Visit: Payer: Self-pay | Admitting: Physician Assistant

## 2016-06-30 ENCOUNTER — Other Ambulatory Visit: Payer: Self-pay | Admitting: Physician Assistant

## 2016-07-02 ENCOUNTER — Other Ambulatory Visit: Payer: Self-pay | Admitting: Physician Assistant

## 2016-07-03 NOTE — Telephone Encounter (Signed)
[facility] Cycl[facility]rine 5 mg Last Filled - 03/27/16, #30x2 Last AEX - 02/22/16 Next AEX - [place] Please Advise on refills/SLS 12/04

## 2016-07-03 NOTE — Telephone Encounter (Signed)
[facility] Last filled - 03/27/16, #30x2 Last AEX - 02/22/16 Next AEX - [place]; [place] pharmacy [place] Please Advise on refills/SLS 12/04

## 2016-07-04 ENCOUNTER — Telehealth: Payer: Self-pay | Admitting: Physician Assistant

## 2016-07-04 NOTE — Telephone Encounter (Signed)
Please advise 

## 2016-07-04 NOTE — Telephone Encounter (Signed)
LMOVM details that patient needs an appointment for medication refills.

## 2016-07-04 NOTE — Telephone Encounter (Signed)
Would have to be seen per previous notes.

## 2016-07-04 NOTE — Telephone Encounter (Signed)
Patient's fiance calling to report [place] but is unable to get in with new pcp until 08/10/16. Patient will need refill of all maintenance medications called in to hold him over until he sees new pcp.    Patient willing to come in for ov if need be in order to get meds refilled.  Pharmacy:   [facility] [phone] [place] - [address] [facility] [address] [facility] [address] [phone] (Phone) [phone] (Fax)

## 2016-07-06 ENCOUNTER — Ambulatory Visit (INDEPENDENT_AMBULATORY_CARE_PROVIDER_SITE_OTHER): Payer: BLUE CROSS/BLUE SHIELD | Admitting: Physician Assistant

## 2016-07-06 ENCOUNTER — Encounter: Payer: Self-pay | Admitting: Physician Assistant

## 2016-07-06 VITALS — BP 138/80 | HR 103 | Temp 97.7°F | Resp 14 | Ht 67.0 in | Wt 191.0 lb

## 2016-07-06 DIAGNOSIS — M25562 Pain in left knee: Secondary | ICD-10-CM

## 2016-07-06 DIAGNOSIS — F17200 Nicotine dependence, unspecified, uncomplicated: Secondary | ICD-10-CM

## 2016-07-06 DIAGNOSIS — I10 Essential (primary) hypertension: Secondary | ICD-10-CM

## 2016-07-06 DIAGNOSIS — IMO0002 Reserved for concepts with insufficient information to code with codable children: Secondary | ICD-10-CM

## 2016-07-06 DIAGNOSIS — E1165 Type 2 diabetes mellitus with hyperglycemia: Secondary | ICD-10-CM

## 2016-07-06 DIAGNOSIS — E118 Type 2 diabetes mellitus with unspecified complications: Secondary | ICD-10-CM

## 2016-07-06 DIAGNOSIS — Z23 Encounter for immunization: Secondary | ICD-10-CM | POA: Diagnosis not present

## 2016-07-06 DIAGNOSIS — G8929 Other chronic pain: Secondary | ICD-10-CM

## 2016-07-06 DIAGNOSIS — M25561 Pain in right knee: Secondary | ICD-10-CM

## 2016-07-06 LAB — COMPREHENSIVE METABOLIC PANEL
ALK PHOS: 57 U/L (ref 39–117)
ALT: 11 U/L (ref 0–53)
AST: 14 U/L (ref 0–37)
Albumin: 5.2 g/dL (ref 3.5–5.2)
BUN: 9 mg/dL (ref 6–23)
CO2: 29 meq/L (ref 19–32)
Calcium: 10.9 mg/dL — ABNORMAL HIGH (ref 8.4–10.5)
Chloride: 95 mEq/L — ABNORMAL LOW (ref 96–112)
Creatinine, Ser: 0.92 mg/dL (ref 0.40–1.50)
GFR: 110.55 mL/min (ref >60.00)
GLUCOSE: 382 mg/dL — AB (ref 70–99)
POTASSIUM: 5 meq/L (ref 3.5–5.1)
Sodium: 135 mEq/L (ref 135–145)
Total Bilirubin: 0.3 mg/dL (ref 0.2–1.2)
Total Protein: 7.8 g/dL (ref 6.0–8.3)

## 2016-07-06 LAB — HEMOGLOBIN A1C: Hgb A1c MFr Bld: 13.3 % — ABNORMAL HIGH (ref 4.6–6.5)

## 2016-07-06 MED ORDER — LISINOPRIL 5 MG PO TABS: ORAL_TABLET | ORAL | 2 refills | Status: AC

## 2016-07-06 MED ORDER — METFORMIN HCL 500 MG PO TABS: ORAL_TABLET | ORAL | 1 refills | Status: DC

## 2016-07-06 MED ORDER — DICLOFENAC POTASSIUM 50 MG PO TABS: ORAL_TABLET | ORAL | 1 refills | Status: AC

## 2016-07-06 NOTE — Progress Notes (Signed)
Patient presents to clinic today for follow-up of hypertension and Diabetes Mellitus, II. Patient also presents for medication refill of Diclofenac.    Hypertension -- Is currently on Lisinopril 5 mg daily. Endorses taking as directed. Patient denies chest pain, palpitations, lightheadedness, dizziness, vision changes or frequent headaches.  BP Readings from Last 3 Encounters:  07/06/16 138/80  02/22/16 (!) 136/94  01/04/16 (!) 146/102   Tobacco use has improved somewhat. Is trying to switch to vapor cigarette. Chantix previously prescribed but patient never filled. Wants to quit on his own. .   Diabetes Mellitus II -- Metformin 500 mg twice daily. Is staying very active at work. No other regular exercise regimen. Endorses well-balanced diet overall. Fiance is cutting his carb intake back. Overdue for foot exam. Denies acute concerns today. Eye examination is up-to-date. Flu shot up-to-date. Is due for Pneumovax. Agrees to have today.   Past Medical History:  Diagnosis Date  . Arthritis   . Depression   . History of eating disorder   . Hypertension   . Pancreatitis 2014    Current Outpatient Prescriptions on File Prior to Visit  Medication Sig Dispense Refill  . cyclobenzaprine (FLEXERIL) 5 MG tablet TAKE 1 TABLET(5 MG) BY MOUTH AT BEDTIME 30 tablet 0  . fluticasone (FLONASE) 50 MCG/ACT nasal spray USE 2 SPRAYS IN EACH SIDE DAILY 16 g 5  . gabapentin (NEURONTIN) 100 MG capsule Take 1 capsule (100 mg total) by mouth 3 (three) times daily. 90 capsule 3  . ketorolac (TORADOL) 10 MG tablet TK 1 T PO  Q 6 H PRN P  0  . Krill Oil 1000 MG CAPS Take 1 capsule (1,000 mg total) by mouth daily.    Marland Kitchen. loratadine (CLARITIN) 10 MG tablet Take 10 mg by mouth daily as needed for allergies.    . Multiple Vitamins-Minerals (MULTIVITAMIN ADULT PO) Take 1 tablet by mouth daily.    . varenicline (CHANTIX PAK) 0.5 MG X 11 & 1 MG X 42 tablet Take one 0.5 mg tablet by mouth once daily for 3 days, then  increase to one 0.5 mg tablet twice daily for 4 days, then increase to one 1 mg tablet twice daily. 53 tablet 0  . Vitamin D, Ergocalciferol, (DRISDOL) 50000 units CAPS capsule TAKE 1 CAPSULE BY MOUTH EVERY 7 DAYS 4 capsule 0   No current facility-administered medications on file prior to visit.     No Known Allergies  Family History  Problem Relation Age of Onset  . Hypertension Mother   . Arthritis Mother   . Cancer Father     Colon    Social History   Social History  . Marital status: Single    Spouse name: N/A  . Number of children: 6  . Years of education: N/A   Social History Main Topics  . Smoking status: Current Every Day Smoker    Packs/day: 1.00    Types: Cigarettes    Start date: 11/02/1995  . Smokeless tobacco: Never Used  . Alcohol use 0.0 oz/week  . Drug use: No  . Sexual activity: Not Asked   Other Topics Concern  . None   Social History Narrative  . None   Review of Systems - See HPI.  All other ROS are negative.  BP 138/80   Pulse (!) 103   Temp 97.7 F (36.5 C) (Oral)   Resp 14   Ht 5\' 7"  (1.702 m)   Wt 191 lb (86.6 kg)   SpO2 98%  BMI 29.91 kg/m   Physical Exam  Constitutional: He is oriented to person, place, and time and well-developed, well-nourished, and in no distress.  HENT:  Head: Normocephalic and atraumatic.  Eyes: Conjunctivae are normal.  Neck: Neck supple.  Cardiovascular: Normal rate, regular rhythm, normal heart sounds and intact distal pulses.   Pulmonary/Chest: Effort normal and breath sounds normal. No respiratory distress. He has no wheezes. He has no rales. He exhibits no tenderness.  Neurological: He is alert and oriented to person, place, and time.  Skin: Skin is warm and dry. No rash noted.  Psychiatric: Affect normal.  Vitals reviewed.  Diabetic Foot Form - Detailed   Diabetic Foot Exam - detailed Diabetic Foot exam was performed with the following findings:  Yes 07/06/2016  9:43 AM  Visual Foot Exam  completed.:  Yes  Is there a history of foot ulcer?:  No Can the patient see the bottom of their feet?:  Yes Are the shoes appropriate in style and fit?:  Yes Is there swelling or and abnormal foot shape?:  No Are the toenails long?:  No Are the toenails thick?:  No Do you have pain in calf while walking?:  No Is there a claw toe deformity?:  No Is there elevated skin temparature?:  No Is there limited skin dorsiflexion?:  No Is there foot or ankle muscle weakness?:  No Are the toenails ingrown?:  No Normal Range of Motion:  Yes Pulse Foot Exam completed.:  Yes  Right posterior Tibialias:  Present Left posterior Tibialias:  Present  Right Dorsalis Pedis:  Present Left Dorsalis Pedis:  Present  Sensory Foot Exam Completed.:  Yes Swelling:  No Semmes-Weinstein Monofilament Test R Foot Test Control:  Neg L Foot Test Control:  Neg  R Site 1-Great Toe:  Neg L Site 1-Great Toe:  Neg  R Site 4:  Neg L Site 4:  Neg  R Site 5:  Neg L Site 5:  Neg       Assessment/Plan: Uncontrolled type 2 diabetes mellitus with complication, without long-term current use of insulin (HCC) Tolerating medications well. Foot exam updated and within normal limits. Eye exam up-to-date. Flu shot up-to-date. Pneumovax given today. Will check CMP and A1C today. Will alter regimen accordingly.   Tobacco use disorder Continues. Did not take Chantix. Refuses further intervention at present.   Bilateral knee pain Diclofenac refilled. Will check CMP today.   Essential hypertension Tolerating Lisinopril 5 mg daily. Tolerating well. Asymptomatic. BP acceptable today. Continue current regimen. Start 81 mg ASA daily due to HTN and DM.     Piedad ClimesMartin, Maresa Morash Cody, PA-C

## 2016-07-06 NOTE — Progress Notes (Signed)
Pre visit review using our clinic review tool, if applicable. No additional management support is needed unless otherwise documented below in the visit note. 

## 2016-07-06 NOTE — Assessment & Plan Note (Signed)
Continues. Did not take Chantix. Refuses further intervention at present.

## 2016-07-06 NOTE — Assessment & Plan Note (Signed)
Tolerating medications well. Foot exam updated and within normal limits. Eye exam up-to-date. Flu shot up-to-date. Pneumovax given today. Will check CMP and A1C today. Will alter regimen accordingly.

## 2016-07-06 NOTE — Patient Instructions (Signed)
Please go to the lab for blood work. I will call you with your results.  Please continue chronic medications. Work on increasing exercise to at least 150 minutes of aerobic exercise per week. Watch those carbs!  Follow-up with your new PCP in January. Feel free to come see us before then if needed.   Diabetes Mellitus and Exercise Exercising regularly is important for your overall health, especially when you have diabetes (diabetes mellitus). Exercising is not only about losing weight. It has many health benefits, such as increasing muscle strength and bone density and reducing body fat and stress. This leads to improved fitness, flexibility, and endurance, all of which result in better overall health. Exercise has additional benefits for people with diabetes, including:  Reducing appetite.  Helping to lower and control blood glucose.  Lowering blood pressure.  Helping to control amounts of fatty substances (lipids) in the blood, such as cholesterol and triglycerides.  Helping the body to respond better to insulin (improving insulin sensitivity).  Reducing how much insulin the body needs.  Decreasing the risk for heart disease by:  Lowering cholesterol and triglyceride levels.  Increasing the levels of good cholesterol.  Lowering blood glucose levels. What is my activity plan? Your health care provider or certified diabetes educator can help you make a plan for the type and frequency of exercise (activity plan) that works for you. Make sure that you:  Do at least 150 minutes of moderate-intensity or vigorous-intensity exercise each week. This could be brisk walking, biking, or water aerobics.  Do stretching and strength exercises, such as yoga or weightlifting, at least 2 times a week.  Spread out your activity over at least 3 days of the week.  Get some form of physical activity every day.  Do not go more than 2 days in a row without some kind of physical  activity.  Avoid being inactive for more than 90 minutes at a time. Take frequent breaks to walk or stretch.  Choose a type of exercise or activity that you enjoy, and set realistic goals.  Start slowly, and gradually increase the intensity of your exercise over time. What do I need to know about managing my diabetes?  Check your blood glucose before and after exercising.  If your blood glucose is higher than 240 mg/dL (40.913.3 mmol/L) before you exercise, check your urine for ketones. If you have ketones in your urine, do not exercise until your blood glucose returns to normal.  Know the symptoms of low blood glucose (hypoglycemia) and how to treat it. Your risk for hypoglycemia increases during and after exercise. Common symptoms of hypoglycemia can include:  Hunger.  Anxiety.  Sweating and feeling clammy.  Confusion.  Dizziness or feeling light-headed.  Increased heart rate or palpitations.  Blurry vision.  Tingling or numbness around the mouth, lips, or tongue.  Tremors or shakes.  Irritability.  Keep a rapid-acting carbohydrate snack available before, during, and after exercise to help prevent or treat hypoglycemia.  Avoid injecting insulin into areas of the body that are going to be exercised. For example, avoid injecting insulin into:  The arms, when playing tennis.  The legs, when jogging.  Keep records of your exercise habits. Doing this can help you and your health care provider adjust your diabetes management plan as needed. Write down:  Food that you eat before and after you exercise.  Blood glucose levels before and after you exercise.  The type and amount of exercise you have done.  When  your insulin is expected to peak, if you use insulin. Avoid exercising at times when your insulin is peaking.  When you start a new exercise or activity, work with your health care provider to make sure the activity is safe for you, and to adjust your insulin,  medicines, or food intake as needed.  Drink plenty of water while you exercise to prevent dehydration or heat stroke. Drink enough fluid to keep your urine clear or pale yellow. This information is not intended to replace advice given to you by your health care provider. Make sure you discuss any questions you have with your health care provider. Document Released: 10/07/2003 Document Revised: 02/04/2016 Document Reviewed: 12/27/2015 [employer] Interactive Patient Education  2017 [employer]

## 2016-07-06 NOTE — Assessment & Plan Note (Signed)
Tolerating Lisinopril 5 mg daily. Tolerating well. Asymptomatic. BP acceptable today. Continue current regimen. Start 81 mg ASA daily due to HTN and DM.

## 2016-07-06 NOTE — Assessment & Plan Note (Signed)
Diclofenac refilled. Will check CMP today.

## 2016-07-07 ENCOUNTER — Other Ambulatory Visit: Payer: Self-pay | Admitting: Physician Assistant

## 2016-07-07 ENCOUNTER — Other Ambulatory Visit: Payer: Self-pay | Admitting: Emergency Medicine

## 2016-07-07 DIAGNOSIS — E118 Type 2 diabetes mellitus with unspecified complications: Principal | ICD-10-CM

## 2016-07-07 DIAGNOSIS — IMO0002 Reserved for concepts with insufficient information to code with codable children: Secondary | ICD-10-CM

## 2016-07-07 DIAGNOSIS — E1165 Type 2 diabetes mellitus with hyperglycemia: Secondary | ICD-10-CM

## 2016-07-07 MED ORDER — METFORMIN HCL 500 MG PO TABS: ORAL_TABLET | ORAL | 1 refills | Status: DC

## 2016-07-12 ENCOUNTER — Telehealth: Payer: Self-pay | Admitting: Emergency Medicine

## 2016-07-12 ENCOUNTER — Other Ambulatory Visit: Payer: Self-pay | Admitting: Emergency Medicine

## 2016-07-12 DIAGNOSIS — IMO0002 Reserved for concepts with insufficient information to code with codable children: Secondary | ICD-10-CM

## 2016-07-12 DIAGNOSIS — E1165 Type 2 diabetes mellitus with hyperglycemia: Secondary | ICD-10-CM

## 2016-07-12 DIAGNOSIS — E118 Type 2 diabetes mellitus with unspecified complications: Principal | ICD-10-CM

## 2016-07-12 MED ORDER — METFORMIN HCL 500 MG PO TABS: ORAL_TABLET | ORAL | 1 refills | Status: DC

## 2016-07-12 NOTE — Telephone Encounter (Signed)
Called patient to check on his blood sugar readings.  Patient states he has not been checking his blood sugars. He states he has been taking the Metformin 2 tabs bid. Advised patient to schedule an follow up to recheck sugars. Patient states he will call back to schedule next week.

## 2016-07-23 ENCOUNTER — Other Ambulatory Visit: Payer: Self-pay | Admitting: Physician Assistant

## 2016-07-23 DIAGNOSIS — G894 Chronic pain syndrome: Secondary | ICD-10-CM

## 2016-08-03 ENCOUNTER — Other Ambulatory Visit: Payer: Self-pay | Admitting: Physician Assistant

## 2016-08-26 ENCOUNTER — Other Ambulatory Visit: Payer: Self-pay | Admitting: Physician Assistant

## 2016-08-26 DIAGNOSIS — E118 Type 2 diabetes mellitus with unspecified complications: Principal | ICD-10-CM

## 2016-08-26 DIAGNOSIS — IMO0002 Reserved for concepts with insufficient information to code with codable children: Secondary | ICD-10-CM

## 2016-08-26 DIAGNOSIS — E1165 Type 2 diabetes mellitus with hyperglycemia: Secondary | ICD-10-CM

## 2016-08-31 ENCOUNTER — Other Ambulatory Visit: Payer: Self-pay | Admitting: General Practice

## 2016-08-31 DIAGNOSIS — E1165 Type 2 diabetes mellitus with hyperglycemia: Secondary | ICD-10-CM

## 2016-08-31 DIAGNOSIS — E118 Type 2 diabetes mellitus with unspecified complications: Principal | ICD-10-CM

## 2016-08-31 DIAGNOSIS — IMO0002 Reserved for concepts with insufficient information to code with codable children: Secondary | ICD-10-CM

## 2016-08-31 MED ORDER — METFORMIN HCL 500 MG PO TABS
ORAL_TABLET | ORAL | 3 refills | Status: AC
Start: 2016-08-31 — End: ?

## 2016-09-06 ENCOUNTER — Other Ambulatory Visit: Payer: Self-pay | Admitting: Physician Assistant

## 2016-09-26 ENCOUNTER — Other Ambulatory Visit: Payer: Self-pay | Admitting: Physician Assistant

## 2017-07-07 IMAGING — CR DG LUMBAR SPINE COMPLETE 4+V
5 series · 5 of 5 positions shown · non-contrast
Comparison: None.

CLINICAL DATA: 54-year-old with chronic greater than 6 month
history of low back pain radiating into both lower extremities. No
known injuries.

EXAM:
LUMBAR SPINE - COMPLETE 4+ VIEW

[t l-spine a.p.]
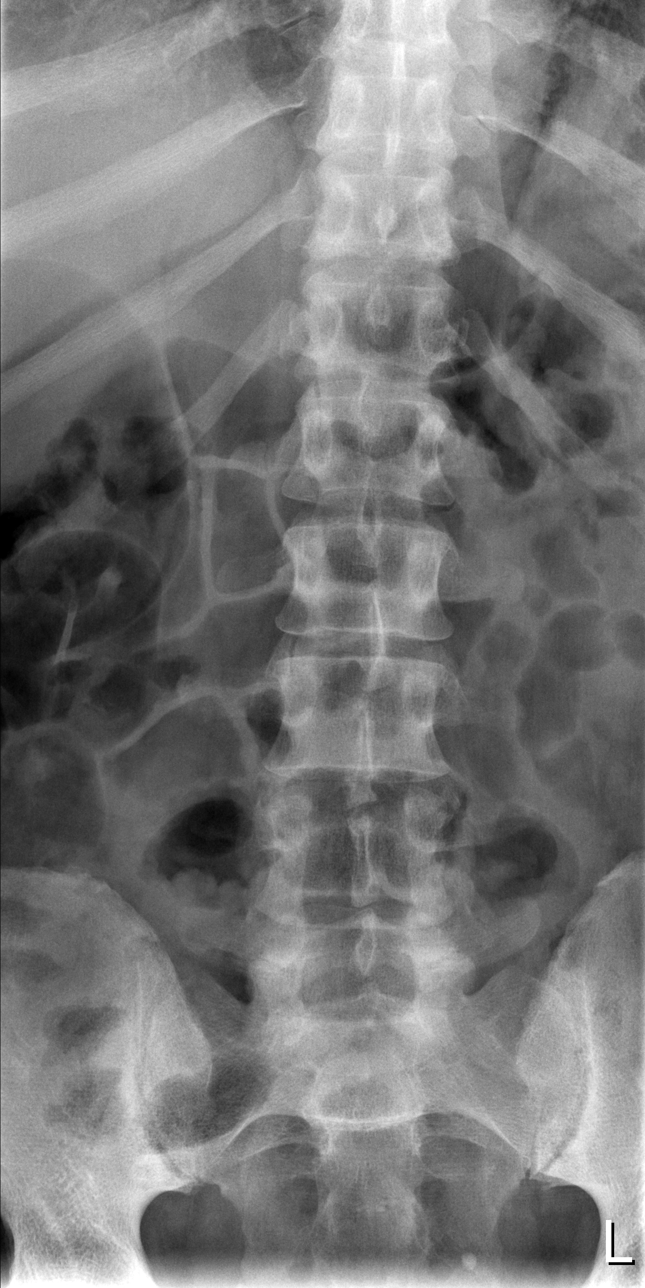

[t l-spine oblique exposure (1 of 2)]
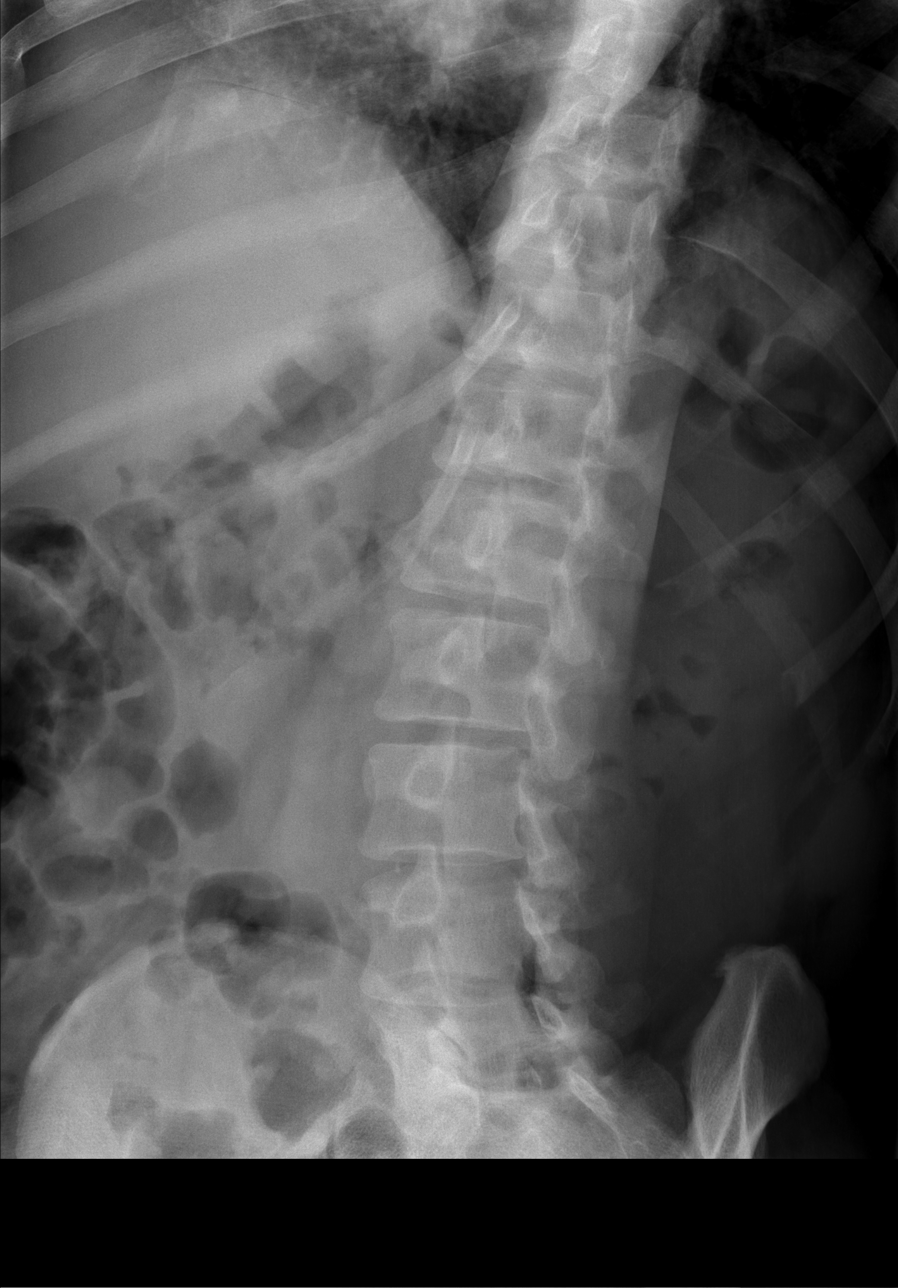

[t l-spine oblique exposure (2 of 2)]
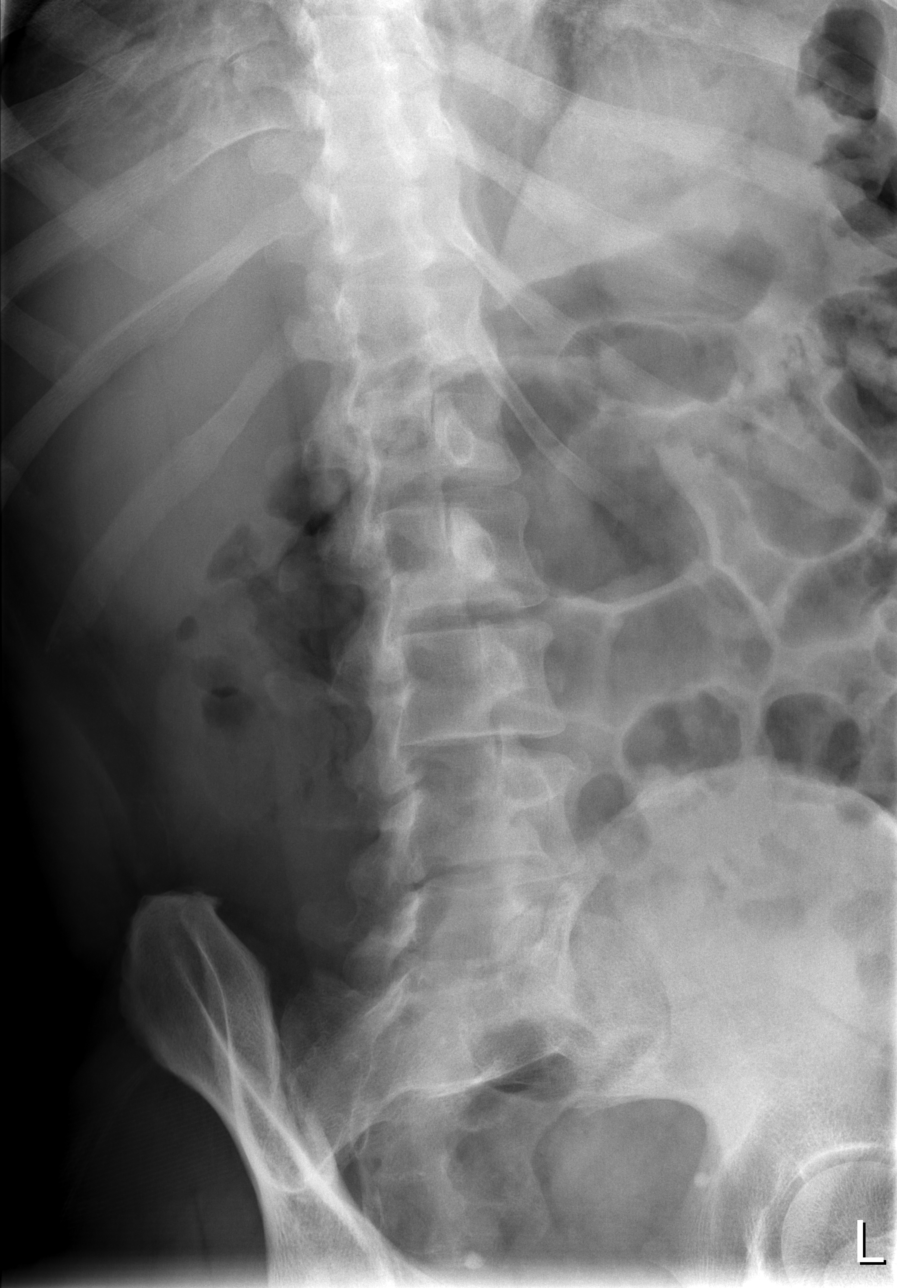

[t l-spine lat]
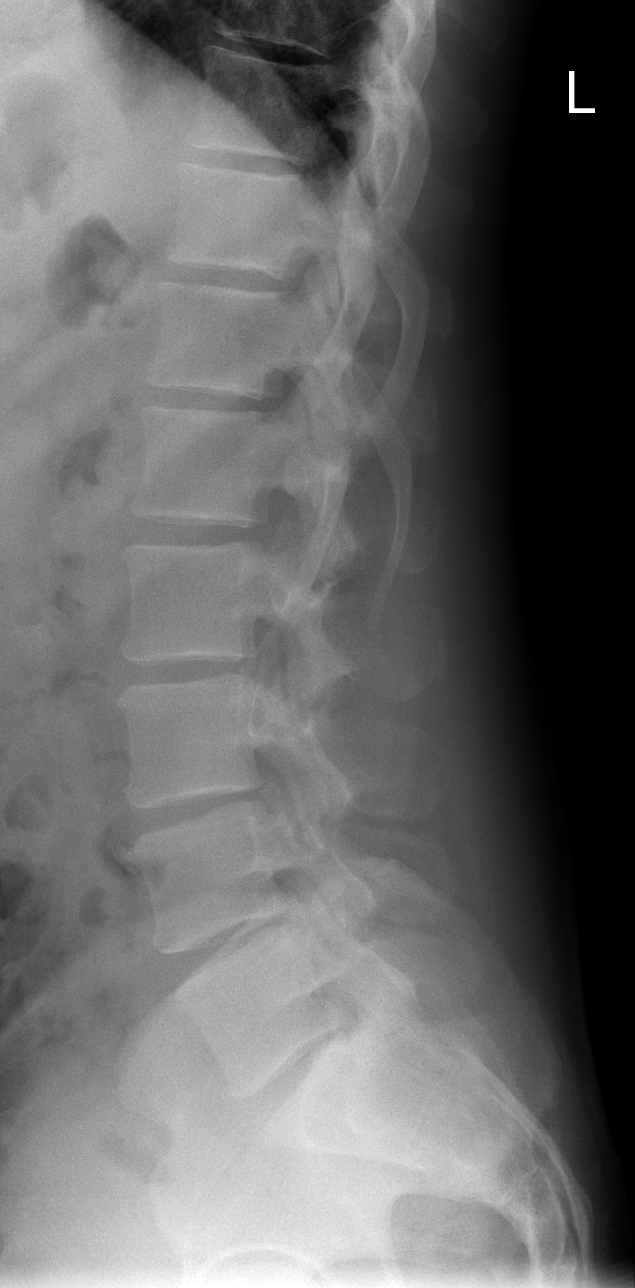

[t l-spine l5-s1 spot]
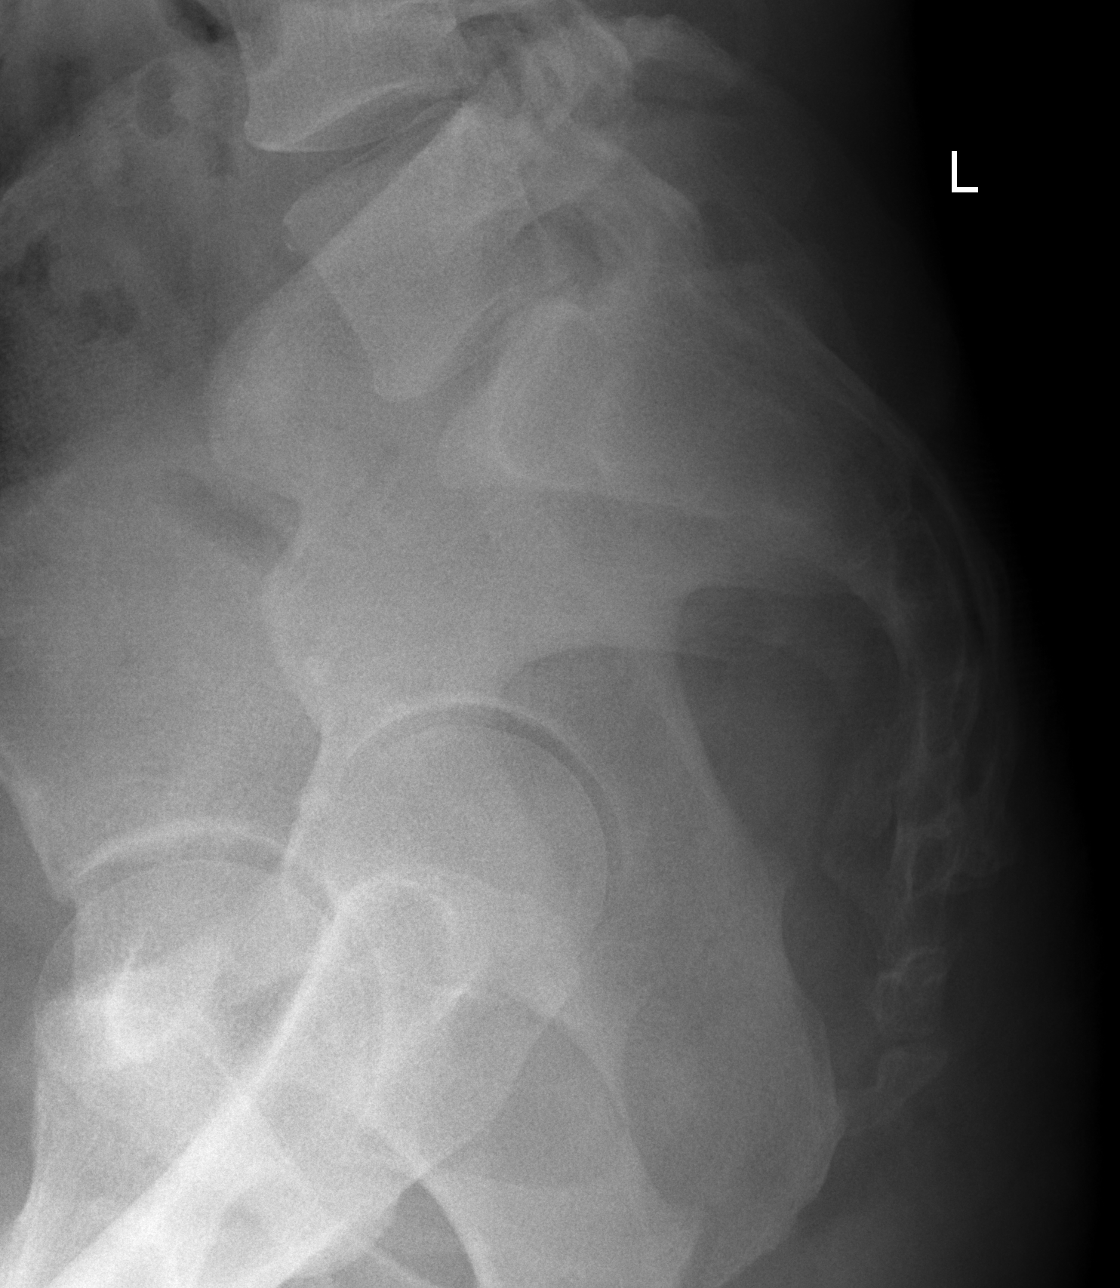

[5 of 5 positions shown; findings below may reference images not displayed]

FINDINGS: Five non rib-bearing lumbar vertebrae with anatomic alignment. No
fractures. Moderate disc space narrowing and associated endplate
hypertrophic changes at L3-4. Mild disc space narrowing at L2-3 and
L4-5. No pars defects. Facet degenerative changes bilaterally at
L5-S1. Visualized sacroiliac joints intact.
IMPRESSION: Moderate degenerative disc disease at L3-4 and mild degenerative
disc disease at L2-3 and L4-5. Bilateral facet degenerative changes
at L5-S1.

## 2017-09-14 ENCOUNTER — Other Ambulatory Visit: Payer: Self-pay | Admitting: Physician Assistant

## 2017-09-14 DIAGNOSIS — IMO0002 Reserved for concepts with insufficient information to code with codable children: Secondary | ICD-10-CM

## 2017-09-14 DIAGNOSIS — E1165 Type 2 diabetes mellitus with hyperglycemia: Secondary | ICD-10-CM

## 2017-09-14 DIAGNOSIS — E118 Type 2 diabetes mellitus with unspecified complications: Principal | ICD-10-CM
